# Patient Record
Sex: Female | Born: 1990 | Race: Black or African American | Hispanic: No | Marital: Single | Smoking: Former smoker
Health system: Southern US, Community
[De-identification: ages and names within clinical notes are randomized; demographics above are authoritative.]

## PROBLEM LIST (undated history)

## (undated) DIAGNOSIS — Z789 Other specified health status: Secondary | ICD-10-CM

## (undated) HISTORY — PX: NO PAST SURGERIES: SHX2092

## (undated) HISTORY — DX: Other specified health status: Z78.9

---
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
## 2017-10-26 ENCOUNTER — Ambulatory Visit (INDEPENDENT_AMBULATORY_CARE_PROVIDER_SITE_OTHER): Payer: Managed Care, Other (non HMO) | Admitting: Obstetrics and Gynecology

## 2017-10-26 ENCOUNTER — Encounter: Payer: Self-pay | Admitting: Obstetrics and Gynecology

## 2017-10-26 VITALS — BP 116/75 | HR 90 | Ht 66.5 in | Wt 204.8 lb

## 2017-10-26 DIAGNOSIS — Z113 Encounter for screening for infections with a predominantly sexual mode of transmission: Secondary | ICD-10-CM

## 2017-10-26 DIAGNOSIS — Z01419 Encounter for gynecological examination (general) (routine) without abnormal findings: Secondary | ICD-10-CM

## 2017-10-26 DIAGNOSIS — Z0000 Encounter for general adult medical examination without abnormal findings: Secondary | ICD-10-CM

## 2017-10-26 NOTE — Progress Notes (Signed)
GYNECOLOGY ANNUAL PREVENTATIVE CARE ENCOUNTER NOTE  Subjective:   Ellen Mooney is a 27 y.o. G0P0000 female here for a annual gynecologic exam. Current complaints: spotting a week before her period, has only happened once. Has regular monthly periods, bleeding 4 days, heavy only on second day. Is sexually active, using condoms for birth control, not interested in anything else for contraception currently.  Denies abnormal vaginal bleeding, discharge, pelvic pain, problems with intercourse or other gynecologic concerns.    Gynecologic History Patient's last menstrual period was 10/03/2017 (exact date). Contraception: condoms Last Pap: has had but not sure when. Results were: normal Last mammogram: n/a Gardisil: has received  Obstetric History OB History  Gravida Para Term Preterm AB Living  0 0 0 0 0 0  SAB TAB Ectopic Multiple Live Births  0 0 0 0 0       Past Medical History:  Diagnosis Date  . Known health problems: none    Past Surgical History:  Procedure Laterality Date  . NO PAST SURGERIES      No current outpatient medications on file prior to visit.   No current facility-administered medications on file prior to visit.     Allergies  Allergen Reactions  . Peanut-Containing Drug Products Anaphylaxis    Social History   Socioeconomic History  . Marital status: Single    Spouse name: Not on file  . Number of children: Not on file  . Years of education: Not on file  . Highest education level: Not on file  Social Needs  . Financial resource strain: Not on file  . Food insecurity - worry: Not on file  . Food insecurity - inability: Not on file  . Transportation needs - medical: Not on file  . Transportation needs - non-medical: Not on file  Occupational History  . Not on file  Tobacco Use  . Smoking status: Current Every Day Smoker    Types: E-cigarettes  . Smokeless tobacco: Never Used  . Tobacco comment: Juuel Vape  Substance and Sexual  Activity  . Alcohol use: Yes    Alcohol/week: 4.2 oz    Types: 7 Glasses of wine per week    Frequency: Never  . Drug use: No  . Sexual activity: Yes    Birth control/protection: None  Other Topics Concern  . Not on file  Social History Narrative  . Not on file    Family History  Problem Relation Age of Onset  . Diabetes Paternal Grandfather   . Hypertension Paternal Grandfather   . Stroke Paternal Grandfather   . Heart attack Paternal Grandfather   . Cancer Paternal Grandmother        Breast  . Diabetes Maternal Grandfather   . Hypertension Maternal Grandfather   . Thyroid disease Father   . Hypertension Father   . Spondylolisthesis Mother   . Clotting disorder Mother   . Asthma Brother     Diet: could be better Exercise: walks 4-5 miles at work  The following portions of the patient's history were reviewed and updated as appropriate: allergies, current medications, past family history, past medical history, past social history, past surgical history and problem list.  Review of Systems Pertinent items are noted in HPI.   Objective:  BP 116/75   Pulse 90   Ht 5' 6.5" (1.689 m)   Wt 204 lb 12.8 oz (92.9 kg)   LMP 10/03/2017 (Exact Date)   BMI 32.56 kg/m  CONSTITUTIONAL: Well-developed, well-nourished female in no acute  distress.  HENT:  Normocephalic, atraumatic, External right and left ear normal. Oropharynx is clear and moist EYES: Conjunctivae and EOM are normal. Pupils are equal, round, and reactive to light. No scleral icterus.  NECK: Normal range of motion, supple, no masses.  Normal thyroid.  SKIN: Skin is warm and dry. No rash noted. Not diaphoretic. No erythema. No pallor. NEUROLOGIC: Alert and oriented to person, place, and time. Normal reflexes, muscle tone coordination. No cranial nerve deficit noted. PSYCHIATRIC: Normal mood and affect. Normal behavior. Normal judgment and thought content. CARDIOVASCULAR: Normal heart rate noted, regular  rhythm RESPIRATORY: Clear to auscultation bilaterally. Effort and breath sounds normal, no problems with respiration noted. BREASTS: Symmetric in size. No masses, skin changes, nipple drainage, or lymphadenopathy. Nipple rings present bilaterally ABDOMEN: Soft, normal bowel sounds, no distention noted.  No tenderness, rebound or guarding.  PELVIC: Normal appearing external genitalia; normal appearing vaginal mucosa and cervix.  No abnormal discharge noted.  Pap smear obtained.  Normal uterine size, no other palpable masses, no uterine or adnexal tenderness. MUSCULOSKELETAL: Normal range of motion. No tenderness.  No cyanosis, clubbing, or edema.  2+ distal pulses.   Assessment and Plan:   1. Annual physical exam - Cervicovaginal ancillary only - Cytology - PAP - declines further contraception  2. Routine screening for STI (sexually transmitted infection) - Hepatitis B surface antigen - Hepatitis C antibody - HIV antibody - RPR  Smoking and tobacco cessation was discussed at today's visit for 4 minutes    Will follow up results of pap smear/STI screen and manage accordingly. Encouraged improvement in diet and exercise.    Routine preventative health maintenance measures emphasized. Please refer to After Visit Summary for other counseling recommendations.    Baldemar LenisK. Meryl Davis, M.D. Attending Obstetrician & Gynecologist, [facility], [facility]

## 2017-10-27 LAB — HEPATITIS C ANTIBODY: Hep C Virus Ab: <0.1 s/co ratio (ref 0.0–0.9)

## 2017-10-27 LAB — HEPATITIS B SURFACE ANTIGEN: Hepatitis B Surface Ag: NEGATIVE

## 2017-10-27 LAB — RPR: RPR Ser Ql: NONREACTIVE

## 2017-10-27 LAB — HIV ANTIBODY (ROUTINE TESTING W REFLEX): HIV Screen 4th Generation wRfx: NONREACTIVE

## 2017-10-28 ENCOUNTER — Encounter: Payer: Self-pay | Admitting: Family Medicine

## 2017-10-28 LAB — CYTOLOGY - PAP: Diagnosis: NEGATIVE

## 2017-10-28 LAB — CERVICOVAGINAL ANCILLARY ONLY
Chlamydia: NEGATIVE
Neisseria Gonorrhea: NEGATIVE
Trichomonas: NEGATIVE

## 2017-12-22 NOTE — Progress Notes (Signed)
Subjective:    Patient ID: Ellen Mooney, female    DOB: Aug 04, 1991, 27 y.o.   MRN: [id number]  HPI Chief Complaint  Patient presents with  . Annual Exam    physcial for work, [facility]  went in Feb;    She is new to the practice and here for a complete physical exam. Previous medical care: moved here from [place], [place].  Last CPE: 10/2017 at her OB/GYN   Other providers: Dr. Earlene Plater (OBGYN)  No concerns today.   Social history: Lives alone, works at [employer] as a Chartered certified accountant. Has a plan to go to graduate school. Degrees in psychology and criminal justice.   vapes, drinks alcohol- 2 medium size glasses per night for the past 2 months, denies drug use   States she drinks alcohol nightly due to boredom. Denies self medicating for an underlying issue.   Diet: fairly unhealthy  Excerise: nothing currently   Immunizations: Tdap 10 year ago.   Health maintenance:  Mammogram: never  Colonoscopy: never  Last Gynecological Exam: Feb 18th, 2019 Last Menstrual cycle: 12/19/17 Pregnancies: 0 Uses condoms  Last Dental Exam: 09/2016  Last Eye Exam: 10/2016  Wears seatbelt always, smoke detectors in home and functioning, does not text while driving and feels safe in home environment.   Reviewed allergies, medications, past medical, surgical, family, and social history.   Review of Systems Review of Systems Constitutional: -fever, -chills, -sweats, -unexpected weight change,-fatigue ENT: -runny nose, -ear pain, -sore throat Cardiology:  -chest pain, -palpitations, -edema Respiratory: -cough, -shortness of breath, -wheezing Gastroenterology: -abdominal pain, -nausea, -vomiting, -diarrhea, -constipation  Hematology: -bleeding or bruising problems Musculoskeletal: -arthralgias, -myalgias, -joint swelling, -back pain Ophthalmology: -vision changes Urology: -dysuria, -difficulty urinating, -hematuria, -urinary frequency, -urgency Neurology: -headache,  -weakness, -tingling, -numbness       Objective:   Physical Exam BP 124/80 (BP Location: Right Arm, Patient Position: Sitting, Cuff Size: Normal)   Pulse 70   Temp 98.1 F (36.7 C) (Oral)   Ht 5' 5.5" (1.664 m)   Wt 203 lb 6.4 oz (92.3 kg)   LMP 12/19/2017 (Exact Date)   SpO2 98%   BMI 33.33 kg/m   General Appearance:    Alert, cooperative, no distress, appears stated age  Head:    Normocephalic, without obvious abnormality, atraumatic  Eyes:    PERRL, conjunctiva/corneas clear, EOM's intact, fundi    benign  Ears:    Normal TM's and external ear canals  Nose:   Nares normal, mucosa normal, no drainage or sinus   tenderness  Throat:   Lips, mucosa, and tongue normal; teeth and gums normal  Neck:   Supple, no lymphadenopathy;  thyroid:  no   enlargement/tenderness/nodules; no carotid   bruit or JVD  Back:    Spine nontender, no curvature, ROM normal, no CVA     tenderness  Lungs:     Clear to auscultation bilaterally without wheezes, rales or     ronchi; respirations unlabored  Chest Wall:    No tenderness or deformity   Heart:    Regular rate and rhythm, S1 and S2 normal, no murmur, rub   or gallop  Breast Exam:    Done at OB/GYN  Abdomen:     Soft, non-tender, nondistended, normoactive bowel sounds,    no masses, no hepatosplenomegaly  Genitalia:    Done at OB/GYN  Rectal:    Not performed due to age<40 and no related complaints  Extremities:   No clubbing,  cyanosis or edema  Pulses:   2+ and symmetric all extremities  Skin:   Skin color, texture, turgor normal, no rashes or lesions  Lymph nodes:   Cervical, supraclavicular, and axillary nodes normal  Neurologic:   CNII-XII intact, normal strength, sensation and gait; reflexes 2+ and symmetric throughout          Psych:   Normal mood, affect, hygiene and grooming.     Urinalysis dipstick:       Assessment & Plan:  Routine general medical examination at a health care facility - Plan: CBC with Differential/Platelet,  Comprehensive metabolic panel, POCT Urinalysis DIP (Proadvantage Device), TSH  Obesity (BMI 30-39.9) - Plan: CBC with Differential/Platelet, Comprehensive metabolic panel, TSH  Family history of thyroid disease in father - Plan: TSH  Screening for lipid disorders - Plan: Lipid panel  Need for Tdap vaccination - Plan: Tdap vaccine greater than or equal to 7yo IM  Electronic cigarette use  Vaccine counseling  She appears to be doing well overall. Tdap given and counseling done on all components of vaccine. Advised her to cut back on alcohol use, she is drinking currently half a bottle of wine per day. Discussed potential risks involved with long term alcohol use.  Encouraged her to stop vaping. Counseling done on healthy lifestyle.  She is aware that her BMI places her in the obese category.  Discussed potential health conditions related to obesity. Advised to cut back on carbohydrates and increase her physical activity. Follow-up pending labs.

## 2017-12-23 ENCOUNTER — Encounter: Payer: Self-pay | Admitting: Family Medicine

## 2017-12-23 ENCOUNTER — Ambulatory Visit (INDEPENDENT_AMBULATORY_CARE_PROVIDER_SITE_OTHER): Payer: Managed Care, Other (non HMO) | Admitting: Family Medicine

## 2017-12-23 VITALS — BP 124/80 | HR 70 | Temp 98.1°F | Ht 65.5 in | Wt 203.4 lb

## 2017-12-23 DIAGNOSIS — Z13220 Encounter for screening for lipoid disorders: Secondary | ICD-10-CM | POA: Diagnosis not present

## 2017-12-23 DIAGNOSIS — Z23 Encounter for immunization: Secondary | ICD-10-CM

## 2017-12-23 DIAGNOSIS — Z789 Other specified health status: Secondary | ICD-10-CM

## 2017-12-23 DIAGNOSIS — Z0000 Encounter for general adult medical examination without abnormal findings: Secondary | ICD-10-CM

## 2017-12-23 DIAGNOSIS — Z7185 Encounter for immunization safety counseling: Secondary | ICD-10-CM

## 2017-12-23 DIAGNOSIS — E669 Obesity, unspecified: Secondary | ICD-10-CM | POA: Diagnosis not present

## 2017-12-23 DIAGNOSIS — Z8349 Family history of other endocrine, nutritional and metabolic diseases: Secondary | ICD-10-CM

## 2017-12-23 DIAGNOSIS — Z7189 Other specified counseling: Secondary | ICD-10-CM | POA: Diagnosis not present

## 2017-12-23 NOTE — Patient Instructions (Addendum)
Cut back on alcohol use.  Stop vaping.  Make sure you are getting at least 150 minutes of physical activity outside of your usual daily activity. We will call you with your lab results.  Preventative Care for Adults - Female      MAINTAIN REGULAR HEALTH EXAMS:  A routine yearly physical is a good way to check in with your primary care provider about your health and preventive screening. It is also an opportunity to share updates about your health and any concerns you have, and receive a thorough all-over exam.   Most health insurance companies pay for at least some preventative services.  Check with your health plan for specific coverages.  WHAT PREVENTATIVE SERVICES DO WOMEN NEED?  Adult women should have their weight and blood pressure checked regularly.   Women age 28 and older should have their cholesterol levels checked regularly.  Women should be screened for cervical cancer with a Pap smear and pelvic exam beginning at age 28.  Breast cancer screening generally begins at age 28 with a mammogram and breast exam by your primary care provider.    Beginning at age 28 and continuing to age 28, women should be screened for colorectal cancer.  Certain people may need continued testing until age 28.  Updating vaccinations is part of preventative care.  Vaccinations help protect against diseases such as the flu.  Osteoporosis is a disease in which the bones lose minerals and strength as we age. Women ages 4765 and over should discuss this with their caregivers, as should women after menopause who have other risk factors.  Lab tests are generally done as part of preventative care to screen for anemia and blood disorders, to screen for problems with the kidneys and liver, to screen for bladder problems, to check blood sugar, and to check your cholesterol level.  Preventative services generally include counseling about diet, exercise, avoiding tobacco, drugs, excessive alcohol consumption, and  sexually transmitted infections.    GENERAL RECOMMENDATIONS FOR GOOD HEALTH:  Healthy diet:  Eat a variety of foods, including fruit, vegetables, animal or vegetable protein, such as meat, fish, chicken, and eggs, or beans, lentils, tofu, and grains, such as rice.  Drink plenty of water daily.  Decrease saturated fat in the diet, avoid lots of red meat, processed foods, sweets, fast foods, and fried foods.  Exercise:  Aerobic exercise helps maintain good heart health. At least 30-40 minutes of moderate-intensity exercise is recommended. For example, a brisk walk that increases your heart rate and breathing. This should be done on most days of the week.   Find a type of exercise or a variety of exercises that you enjoy so that it becomes a part of your daily life.  Examples are running, walking, swimming, water aerobics, and biking.  For motivation and support, explore group exercise such as aerobic class, spin class, Zumba, Yoga,or  martial arts, etc.    Set exercise goals for yourself, such as a certain weight goal, walk or run in a race such as a 5k walk/run.  Speak to your primary care provider about exercise goals.  Disease prevention:  If you smoke or chew tobacco, find out from your caregiver how to quit. It can literally save your life, no matter how long you have been a tobacco user. If you do not use tobacco, never begin.   Maintain a healthy diet and normal weight. Increased weight leads to problems with blood pressure and diabetes.   The Body Mass Index  or BMI is a way of measuring how much of your body is fat. Having a BMI above 27 increases the risk of heart disease, diabetes, hypertension, stroke and other problems related to obesity. Your caregiver can help determine your BMI and based on it develop an exercise and dietary program to help you achieve or maintain this important measurement at a healthful level.  High blood pressure causes heart and blood vessel problems.   Persistent high blood pressure should be treated with medicine if weight loss and exercise do not work.   Fat and cholesterol leaves deposits in your arteries that can block them. This causes heart disease and vessel disease elsewhere in your body.  If your cholesterol is found to be high, or if you have heart disease or certain other medical conditions, then you may need to have your cholesterol monitored frequently and be treated with medication.   Ask if you should have a cardiac stress test if your history suggests this. A stress test is a test done on a treadmill that looks for heart disease. This test can find disease prior to there being a problem.  Menopause can be associated with physical symptoms and risks. Hormone replacement therapy is available to decrease these. You should talk to your caregiver about whether starting or continuing to take hormones is right for you.   Osteoporosis is a disease in which the bones lose minerals and strength as we age. This can result in serious bone fractures. Risk of osteoporosis can be identified using a bone density scan. Women ages 94 and over should discuss this with their caregivers, as should women after menopause who have other risk factors. Ask your caregiver whether you should be taking a calcium supplement and Vitamin D, to reduce the rate of osteoporosis.   Avoid drinking alcohol in excess (more than two drinks per day).  Avoid use of street drugs. Do not share needles with anyone. Ask for professional help if you need assistance or instructions on stopping the use of alcohol, cigarettes, and/or drugs.  Brush your teeth twice a day with fluoride toothpaste, and floss once a day. Good oral hygiene prevents tooth decay and gum disease. The problems can be painful, unattractive, and can cause other health problems. Visit your dentist for a routine oral and dental check up and preventive care every 6-12 months.   Look at your skin regularly.  Use a  mirror to look at your back. Notify your caregivers of changes in moles, especially if there are changes in shapes, colors, a size larger than a pencil eraser, an irregular border, or development of new moles.  Safety:  Use seatbelts 100% of the time, whether driving or as a passenger.  Use safety devices such as hearing protection if you work in environments with loud noise or significant background noise.  Use safety glasses when doing any work that could send debris in to the eyes.  Use a helmet if you ride a bike or motorcycle.  Use appropriate safety gear for contact sports.  Talk to your caregiver about gun safety.  Use sunscreen with a SPF (or skin protection factor) of 15 or greater.  Lighter skinned people are at a greater risk of skin cancer. Don't forget to also wear sunglasses in order to protect your eyes from too much damaging sunlight. Damaging sunlight can accelerate cataract formation.   Practice safe sex. Use condoms. Condoms are used for birth control and to help reduce the spread of sexually transmitted  infections (or STIs).  Some of the STIs are gonorrhea (the clap), chlamydia, syphilis, trichomonas, herpes, HPV (human papilloma virus) and HIV (human immunodeficiency virus) which causes AIDS. The herpes, HIV and HPV are viral illnesses that have no cure. These can result in disability, cancer and death.   Keep carbon monoxide and smoke detectors in your home functioning at all times. Change the batteries every 6 months or use a model that plugs into the wall.   Vaccinations:  Stay up to date with your tetanus shots and other required immunizations. You should have a booster for tetanus every 10 years. Be sure to get your flu shot every year, since 5%-20% of the [place] population comes down with the flu. The flu vaccine changes each year, so being vaccinated once is not enough. Get your shot in the fall, before the flu season peaks.   Other vaccines to consider:  Human Papilloma  Virus or HPV causes cancer of the cervix, and other infections that can be transmitted from person to person. There is a vaccine for HPV, and females should get immunized between the ages of 16 and 14. It requires a series of 3 shots.   Pneumococcal vaccine to protect against certain types of pneumonia.  This is normally recommended for adults age 66 or older.  However, adults younger than 28 years old with certain underlying conditions such as diabetes, heart or lung disease should also receive the vaccine.  Shingles vaccine to protect against Varicella Zoster if you are older than age 67, or younger than 28 years old with certain underlying illness.  Hepatitis A vaccine to protect against a form of infection of the liver by a virus acquired from food.  Hepatitis B vaccine to protect against a form of infection of the liver by a virus acquired from blood or body fluids, particularly if you work in health care.  If you plan to travel internationally, check with your local health department for specific vaccination recommendations.  Cancer Screening:  Breast cancer screening is essential to preventive care for women. All women age 83 and older should perform a breast self-exam every month. At age 58 and older, women should have their caregiver complete a breast exam each year. Women at ages 93 and older should have a mammogram (x-ray film) of the breasts. Your caregiver can discuss how often you need mammograms.    Cervical cancer screening includes taking a Pap smear (sample of cells examined under a microscope) from the cervix (end of the uterus). It also includes testing for HPV (Human Papilloma Virus, which can cause cervical cancer). Screening and a pelvic exam should begin at age 84, or 3 years after a woman becomes sexually active. Screening should occur every year, with a Pap smear but no HPV testing, up to age 41. After age 49, you should have a Pap smear every 3 years with HPV testing, if no  HPV was found previously.   Most routine colon cancer screening begins at the age of 50. On a yearly basis, doctors may provide special easy to use take-home tests to check for hidden blood in the stool. Sigmoidoscopy or colonoscopy can detect the earliest forms of colon cancer and is life saving. These tests use a small camera at the end of a tube to directly examine the colon. Speak to your caregiver about this at age 73, when routine screening begins (and is repeated every 5 years unless early forms of pre-cancerous polyps or small growths are found).

## 2017-12-24 LAB — COMPREHENSIVE METABOLIC PANEL
ALT: 11 IU/L (ref 0–32)
AST: 14 IU/L (ref 0–40)
Albumin/Globulin Ratio: 2.2 (ref 1.2–2.2)
Albumin: 4.6 g/dL (ref 3.5–5.5)
Alkaline Phosphatase: 47 IU/L (ref 39–117)
BUN/Creatinine Ratio: 10 (ref 9–23)
BUN: 8 mg/dL (ref 6–20)
Bilirubin Total: 0.3 mg/dL (ref 0.0–1.2)
CO2: 23 mmol/L (ref 20–29)
Calcium: 9.1 mg/dL (ref 8.7–10.2)
Chloride: 104 mmol/L (ref 96–106)
Creatinine, Ser: 0.77 mg/dL (ref 0.57–1.00)
GFR calc Af Amer: 122 mL/min/{1.73_m2} (ref >59)
GFR calc non Af Amer: 106 mL/min/{1.73_m2} (ref >59)
Globulin, Total: 2.1 g/dL (ref 1.5–4.5)
Glucose: 77 mg/dL (ref 65–99)
Potassium: 4 mmol/L (ref 3.5–5.2)
Sodium: 140 mmol/L (ref 134–144)
Total Protein: 6.7 g/dL (ref 6.0–8.5)

## 2017-12-24 LAB — LIPID PANEL
Chol/HDL Ratio: 2.2 ratio (ref 0.0–4.4)
Cholesterol, Total: 138 mg/dL (ref 100–199)
HDL: 64 mg/dL
LDL Calculated: 62 mg/dL (ref 0–99)
Triglycerides: 60 mg/dL (ref 0–149)
VLDL Cholesterol Cal: 12 mg/dL (ref 5–40)

## 2017-12-24 LAB — CBC WITH DIFFERENTIAL/PLATELET
Basophils Absolute: 0 10*3/uL (ref 0.0–0.2)
Basos: 0 %
EOS (ABSOLUTE): 0.6 10*3/uL — ABNORMAL HIGH (ref 0.0–0.4)
Eos: 6 %
Hematocrit: 37.1 % (ref 34.0–46.6)
Hemoglobin: 12.5 g/dL (ref 11.1–15.9)
Immature Grans (Abs): 0 10*3/uL (ref 0.0–0.1)
Immature Granulocytes: 0 %
Lymphocytes Absolute: 2.4 10*3/uL (ref 0.7–3.1)
Lymphs: 23 %
MCH: 28.6 pg (ref 26.6–33.0)
MCHC: 33.7 g/dL (ref 31.5–35.7)
MCV: 85 fL (ref 79–97)
Monocytes Absolute: 0.8 10*3/uL (ref 0.1–0.9)
Monocytes: 8 %
Neutrophils Absolute: 6.3 10*3/uL (ref 1.4–7.0)
Neutrophils: 63 %
Platelets: 262 10*3/uL (ref 150–379)
RBC: 4.37 x10E6/uL (ref 3.77–5.28)
RDW: 13.4 % (ref 12.3–15.4)
WBC: 10.1 10*3/uL (ref 3.4–10.8)

## 2017-12-24 LAB — TSH: TSH: 1.26 u[IU]/mL (ref 0.450–4.500)

## 2018-03-17 ENCOUNTER — Encounter: Payer: Self-pay | Admitting: Family Medicine

## 2018-03-17 ENCOUNTER — Ambulatory Visit (INDEPENDENT_AMBULATORY_CARE_PROVIDER_SITE_OTHER): Payer: Managed Care, Other (non HMO) | Admitting: Family Medicine

## 2018-03-17 VITALS — BP 128/80 | HR 72 | Temp 98.8°F | Resp 16 | Ht 66.5 in | Wt 208.6 lb

## 2018-03-17 DIAGNOSIS — R197 Diarrhea, unspecified: Secondary | ICD-10-CM | POA: Diagnosis not present

## 2018-03-17 DIAGNOSIS — R110 Nausea: Secondary | ICD-10-CM | POA: Diagnosis not present

## 2018-03-17 DIAGNOSIS — Z8249 Family history of ischemic heart disease and other diseases of the circulatory system: Secondary | ICD-10-CM | POA: Diagnosis not present

## 2018-03-17 DIAGNOSIS — Z3201 Encounter for pregnancy test, result positive: Secondary | ICD-10-CM

## 2018-03-17 DIAGNOSIS — Z9889 Other specified postprocedural states: Secondary | ICD-10-CM | POA: Diagnosis not present

## 2018-03-17 DIAGNOSIS — Z309 Encounter for contraceptive management, unspecified: Secondary | ICD-10-CM | POA: Diagnosis not present

## 2018-03-17 LAB — POCT URINE PREGNANCY: Preg Test, Ur: POSITIVE — AB

## 2018-03-17 NOTE — Progress Notes (Signed)
   Subjective:    Patient ID: Ellen Mooney, female    DOB: Oct 23, 1990, 27 y.o.   MRN: [id number]  HPI Chief Complaint  Patient presents with  . birth control    start on another birth control.    She is here requesting to switch birth control pills.  [facility] [place] States she had a D & C on June 28th 2019. States she was [redacted] weeks pregnant. No children.   States she started on Sprintec 2 weeks ago and stopped it 2 days ago due to side effects. Complains of severe nausea and diarrhea that seems to be improving since being off OCP. States her face also broke out with pimples. Diarrhea is improving. No vomiting or abdominal pain.  Abdominal cramping 3 days ago that has since resolved.   States in the past she has been on Depo Provera injections. Is not interested in this method.   No fever, chills, headache, dizziness, chest pain, palpitations, shortness of breath, back pain, abdominal pain, urinary symptoms, vaginal discharge, leg or calf pain.   States she is doing fine emotionally.   Reports her mother had a DVT and PEs thought to be related to birth control.   Last CPE with me 12/23/2017 and with her OB/GYN Dr. Earlene Plateravis on 10/26/2017. Normal pap smear and negative STI panel.   LMP: May 2019  Reviewed allergies, medications, past medical, surgical, family, and social history.    Review of Systems Pertinent positives and negatives in the history of present illness.     Objective:   Physical Exam BP 128/80   Pulse 72   Temp 98.8 F (37.1 C) (Oral)   Resp 16   Ht 5' 6.5" (1.689 m)   Wt 208 lb 9.6 oz (94.6 kg)   LMP 01/14/2018   SpO2 98%   BMI 33.16 kg/m  Alert and in no distress. Pharyngeal area is normal. Neck is supple without adenopathy or thyromegaly. Cardiac exam shows a regular sinus rhythm without murmurs or gallops. Lungs are clear to auscultation. Abdomen is soft, non distended, nontender, normal BS, no palpable masses. No  CVAT. Extremities without edema, normal pulses. Skin is warm and dry, no pallor.   UPT +    Assessment & Plan:  Encounter for contraceptive management, unspecified type - Plan: POCT urine pregnancy  Nausea - Plan: CBC with Differential/Platelet, Comprehensive metabolic panel  Diarrhea, unspecified type - Plan: CBC with Differential/Platelet, Comprehensive metabolic panel  Family history of DVT  History of elective abortion - Plan: Beta hCG quant (ref lab)  Positive urine pregnancy test - Plan: Beta hCG quant (ref lab)  D & C on 03/05/2018. UPT positive today. Will check labs including HCG quant.  She is well appearing and no sign of infectious process. No sign of physical or emotional distress.  I am referring her back to Dr. Earlene Plateravis, her OB/GYN, for further counseling on contraception due to recent side effects with estrogen and mother with DVT, PE history related to birth control. She is considering an alternative method to OCPs.  Follow up pending labs or as needed.

## 2018-03-17 NOTE — Patient Instructions (Signed)
Call and schedule with your OB/GYN for further evaluation and counseling regarding the best method of birth control for you based on risk factors.

## 2018-03-18 LAB — CBC WITH DIFFERENTIAL/PLATELET
Basophils Absolute: 0.1 10*3/uL (ref 0.0–0.2)
Basos: 1 %
EOS (ABSOLUTE): 0.5 10*3/uL — ABNORMAL HIGH (ref 0.0–0.4)
Eos: 5 %
Hematocrit: 35.7 % (ref 34.0–46.6)
Hemoglobin: 11.6 g/dL (ref 11.1–15.9)
Immature Grans (Abs): 0 10*3/uL (ref 0.0–0.1)
Immature Granulocytes: 0 %
Lymphocytes Absolute: 2.4 10*3/uL (ref 0.7–3.1)
Lymphs: 25 %
MCH: 28.5 pg (ref 26.6–33.0)
MCHC: 32.5 g/dL (ref 31.5–35.7)
MCV: 88 fL (ref 79–97)
Monocytes Absolute: 0.8 10*3/uL (ref 0.1–0.9)
Monocytes: 8 %
Neutrophils Absolute: 5.8 10*3/uL (ref 1.4–7.0)
Neutrophils: 61 %
Platelets: 243 10*3/uL (ref 150–450)
RBC: 4.07 x10E6/uL (ref 3.77–5.28)
RDW: 13.5 % (ref 12.3–15.4)
WBC: 9.5 10*3/uL (ref 3.4–10.8)

## 2018-03-18 LAB — COMPREHENSIVE METABOLIC PANEL
ALT: 10 IU/L (ref 0–32)
AST: 12 IU/L (ref 0–40)
Albumin/Globulin Ratio: 2.1 (ref 1.2–2.2)
Albumin: 4.5 g/dL (ref 3.5–5.5)
Alkaline Phosphatase: 40 IU/L (ref 39–117)
BUN/Creatinine Ratio: 9 (ref 9–23)
BUN: 8 mg/dL (ref 6–20)
Bilirubin Total: 0.3 mg/dL (ref 0.0–1.2)
CO2: 19 mmol/L — ABNORMAL LOW (ref 20–29)
Calcium: 9.1 mg/dL (ref 8.7–10.2)
Chloride: 105 mmol/L (ref 96–106)
Creatinine, Ser: 0.88 mg/dL (ref 0.57–1.00)
GFR calc Af Amer: 104 mL/min/{1.73_m2} (ref >59)
GFR calc non Af Amer: 90 mL/min/{1.73_m2} (ref >59)
Globulin, Total: 2.1 g/dL (ref 1.5–4.5)
Glucose: 84 mg/dL (ref 65–99)
Potassium: 4.2 mmol/L (ref 3.5–5.2)
Sodium: 143 mmol/L (ref 134–144)
Total Protein: 6.6 g/dL (ref 6.0–8.5)

## 2018-03-18 LAB — BETA HCG QUANT (REF LAB): hCG Quant: 241 m[IU]/mL

## 2018-03-22 ENCOUNTER — Ambulatory Visit (INDEPENDENT_AMBULATORY_CARE_PROVIDER_SITE_OTHER): Payer: Managed Care, Other (non HMO) | Admitting: Nurse Practitioner

## 2018-03-22 ENCOUNTER — Encounter: Payer: Self-pay | Admitting: Nurse Practitioner

## 2018-03-22 VITALS — BP 124/85 | HR 79 | Wt 207.5 lb

## 2018-03-22 DIAGNOSIS — Z3202 Encounter for pregnancy test, result negative: Secondary | ICD-10-CM | POA: Diagnosis not present

## 2018-03-22 DIAGNOSIS — Z30013 Encounter for initial prescription of injectable contraceptive: Secondary | ICD-10-CM | POA: Insufficient documentation

## 2018-03-22 DIAGNOSIS — Z3009 Encounter for other general counseling and advice on contraception: Secondary | ICD-10-CM | POA: Diagnosis not present

## 2018-03-22 LAB — POCT URINE PREGNANCY: Preg Test, Ur: NEGATIVE

## 2018-03-22 MED ORDER — MEDROXYPROGESTERONE ACETATE 150 MG/ML IM SUSP: 150.0000 mg | INTRAMUSCULAR | 3 refills | Status: DC

## 2018-03-22 NOTE — Patient Instructions (Signed)
[email or web address] for contraceptive info

## 2018-03-22 NOTE — Progress Notes (Signed)
Pt requests to discuss birth control options. Pt reports mother had a DVT and PE related to [facility]. Recent TAB 03/05/18.

## 2018-03-22 NOTE — Progress Notes (Signed)
   GYNECOLOGY OFFICE VISIT NOTE   History:  27 y.o. G1P0010 here today for birth control consultation.  She denies any abnormal vaginal discharge, bleeding, pelvic pain or other concerns.   She had TAB on 03-05-18 and had a positive pregnancy test 5 days ago.  Used birth control pills for a few days but had nausea, diarrhea, and facial acne so she stopped the pills.  She relayed that her mother had a DVT and PE on "birth control" (unsure of the type).  Discussed non Estrogen methods with her based on this first degree family history.  No history of migraines.  She has used Depo in the past and like it except for the weight gain.  Discussed Nexplanon and stated there still may be some increased weight gain but likely less than Depo.  Discussed more unexpected vaginal bleeding with nexplanon.  Advised there is usually less cramping with menses with Mirena IUD and may have lighter or no menses.  Past Medical History:  Diagnosis Date  . Known health problems: none     Past Surgical History:  Procedure Laterality Date  . NO PAST SURGERIES      The following portions of the patient's history were reviewed and updated as appropriate: allergies, current medications, past family history, past medical history, past social history, past surgical history and problem list.   Health Maintenance:  Normal pap on 10-26-17  Review of Systems:  Pertinent items noted in HPI and remainder of comprehensive ROS otherwise negative.  Objective:  Physical Exam BP 124/85   Pulse 79   Wt 207 lb 8 oz (94.1 kg)   LMP 01/14/2018   BMI 32.99 kg/m  CONSTITUTIONAL: Well-developed, well-nourished female in no acute distress.  HENT:  Normocephalic, atraumatic. External right and left ear normal.  EYES: Conjunctivae and EOM are normal. No scleral icterus.  NECK: Normal range of motion, supple, no masses SKIN: Skin is warm and dry. No rash noted. Not diaphoretic. No erythema. No pallor. NEUROLOGIC: Alert and  oriented to person, place, and time. Normal reflexes, muscle tone coordination. No cranial nerve deficit noted. PSYCHIATRIC: Normal mood and affect. Normal behavior. Normal judgment and thought content. MUSCULOSKELETAL: Normal range of motion. No edema noted.  Labs and Imaging Urine Pregnancy test is negative  Assessment & Plan:  1. Family planning counseling Initiation of Depo - Depo prescribed to her pharmacy and to return later today for injection. Reproductive life plan discussed and she does not want to be pregnant.  Reviewed LARCs and effectiveness of several methods. Will give Depo today - written info given on Mirena and Nexplanon - to come at 8 weeks on Depo for one if these methods if desired.  Advised to ask questions through MyChart if desired.  - POCT urine pregnancy  Please refer to After Visit Summary for other counseling recommendations.   Return today (on 03/22/2018) for nurse visit for Depo.   Total face-to-face time with patient: 15 minutes.  Over 50% of encounter was spent on counseling and coordination of care.  Nolene BernheimERRI Dann Galicia, RN, MSN, NP-BC Nurse Practitioner, [facility], [facility] 03/22/2018 10:17 AM

## 2018-03-23 ENCOUNTER — Ambulatory Visit (INDEPENDENT_AMBULATORY_CARE_PROVIDER_SITE_OTHER): Payer: Managed Care, Other (non HMO)

## 2018-03-23 VITALS — BP 113/73 | HR 72 | Wt 207.1 lb

## 2018-03-23 DIAGNOSIS — Z3042 Encounter for surveillance of injectable contraceptive: Secondary | ICD-10-CM | POA: Diagnosis not present

## 2018-03-23 DIAGNOSIS — IMO0001 Reserved for inherently not codable concepts without codable children: Secondary | ICD-10-CM

## 2018-03-23 MED ORDER — MEDROXYPROGESTERONE ACETATE 150 MG/ML IM SUSP
150.0000 mg | Freq: Once | INTRAMUSCULAR | Status: AC
  Administered 2018-03-23: 150 mg via INTRAMUSCULAR

## 2018-03-23 NOTE — Progress Notes (Signed)
Presents for DEPO, given in Left deltoid, tolerated well.  Next DEPO 10/1-15/2019  Administrations This Visit    medroxyPROGESTERone (DEPO-PROVERA) injection 150 mg    Admin Date 03/23/2018 Action Given Dose 150 mg Route Intramuscular Administered By Maretta BeesMcGlashan, Vamsi Apfel J, RMA

## 2018-06-14 ENCOUNTER — Ambulatory Visit (INDEPENDENT_AMBULATORY_CARE_PROVIDER_SITE_OTHER): Payer: Managed Care, Other (non HMO)

## 2018-06-14 VITALS — BP 125/81 | HR 75 | Wt 216.8 lb

## 2018-06-14 DIAGNOSIS — Z3042 Encounter for surveillance of injectable contraceptive: Secondary | ICD-10-CM | POA: Diagnosis not present

## 2018-06-14 MED ORDER — MEDROXYPROGESTERONE ACETATE 150 MG/ML IM SUSP
150.0000 mg | Freq: Once | INTRAMUSCULAR | Status: AC
  Administered 2018-06-14: 150 mg via INTRAMUSCULAR

## 2018-06-14 NOTE — Progress Notes (Signed)
Presents for DEPO injection, given in LD, tolerated well.  Next DEPO 12/23-01/02/2019  Administrations This Visit    medroxyPROGESTERone (DEPO-PROVERA) injection 150 mg    Admin Date 06/14/2018 Action Given Dose 150 mg Route Intramuscular Administered By Maretta Bees, RMA

## 2018-08-09 ENCOUNTER — Ambulatory Visit (INDEPENDENT_AMBULATORY_CARE_PROVIDER_SITE_OTHER): Payer: Managed Care, Other (non HMO) | Admitting: Family Medicine

## 2018-08-09 ENCOUNTER — Encounter: Payer: Self-pay | Admitting: Family Medicine

## 2018-08-09 VITALS — BP 110/80 | HR 77 | Wt 213.6 lb

## 2018-08-09 DIAGNOSIS — F329 Major depressive disorder, single episode, unspecified: Secondary | ICD-10-CM

## 2018-08-09 DIAGNOSIS — G4700 Insomnia, unspecified: Secondary | ICD-10-CM | POA: Insufficient documentation

## 2018-08-09 DIAGNOSIS — F32A Depression, unspecified: Secondary | ICD-10-CM

## 2018-08-09 NOTE — Progress Notes (Signed)
   Subjective:    Patient ID: Ellen Mooney, female    DOB: 03-26-1991, 27 y.o.   MRN: [id number]  HPI Chief Complaint  Patient presents with  . depression,    depression- feeling down, 3 weeks to a month   She is here with complaints of a 3-4 week history of feeling sad and unmotivated. Also has difficulty focusing at work. Denies history of depression or anxiety.  She had a miscarriage in July. States she has not really dealt with this. She has friends who are currently pregnant and makes her think of her miscarriage.  States she is not sleeping well. Has issues falling asleep. Taking Z-quil and has tried melatonin in the past. This is not new. Has had sleep issues for years.  Works 3 rd shift.   States her mother noticed she was quiet and did not seem to be herself last week.  Denies SI or HI.   Denies drug or alcohol   Eating fairly healthy and exercising.   She is in a relationship. States "it's complicated".   Reports feeling in her usual state of health physically.  Denies fever, chills, dizziness, chest pain, palpitations, shortness of breath, abdominal pain, N/V/D, urinary symptoms, LE edema.    Depression screen [facility] 08/09/2018 12/23/2017  Decreased Interest 1 0  Down, Depressed, Hopeless 1 0  PHQ - 2 Score 2 0  Altered sleeping 3 -  Tired, decreased energy 1 -  Change in appetite 1 -  Feeling bad or failure about yourself  0 -  Trouble concentrating 3 -  Moving slowly or fidgety/restless 3 -  Suicidal thoughts 0 -  PHQ-9 Score 13 -  Difficult doing work/chores Somewhat difficult -    Reviewed allergies, medications, past medical, surgical, family, and social history.    Review of Systems Pertinent positives and negatives in the history of present illness.     Objective:   Physical Exam BP 110/80   Pulse 77   Wt 213 lb 9.6 oz (96.9 kg)   BMI 33.96 kg/m  Alert and oriented and in no acute distress. Not otherwise examined.      Assessment &  Plan:  Depression, unspecified depression type  Insomnia, unspecified type  Discussed that she appears to be going through a rough patch. She admits that she has not dealt with her miscarriage earlier this year. No thoughts of self harm.  Recommend counseling and she is in agreement.  Discussed that sleep issues may or may not be related with how she deals with stress. Good sleep hygiene counseling done. Recommend that she start journaling.  A list of counselors was provided. She will call and schedule.  Follow up with me in 4 weeks or sooner if needed.

## 2018-08-09 NOTE — Patient Instructions (Signed)
You can call to schedule your appointment with the counselor. A few offices are listed below for you to     [facility] [address] [place], [place] [address] [phone]   [facility]  [address], [place], [place] [address]  Phone: [phone]   [facility] [address] [place], [place] [address]  Phone: [phone]   Darryl A. Hyers, RN, PhD, [facility]  [address], [place], [place] [address] Phone: [phone]  [facility]  [address], [place], [place] [address] Phone: [phone]

## 2018-08-11 ENCOUNTER — Ambulatory Visit: Payer: Managed Care, Other (non HMO) | Admitting: Family Medicine

## 2018-08-30 ENCOUNTER — Ambulatory Visit (INDEPENDENT_AMBULATORY_CARE_PROVIDER_SITE_OTHER): Payer: Managed Care, Other (non HMO)

## 2018-08-30 VITALS — BP 121/74 | HR 72 | Wt 216.0 lb

## 2018-08-30 DIAGNOSIS — Z3042 Encounter for surveillance of injectable contraceptive: Secondary | ICD-10-CM

## 2018-08-30 MED ORDER — MEDROXYPROGESTERONE ACETATE 150 MG/ML IM SUSP
150.0000 mg | Freq: Once | INTRAMUSCULAR | Status: AC
  Administered 2018-08-30: 150 mg via INTRAMUSCULAR

## 2018-08-30 NOTE — Progress Notes (Signed)
Presents for DEPO, injection given in LD, tolerated well.  Next DEPO 03/10-24/2020    . Administrations This Visit    medroxyPROGESTERone (DEPO-PROVERA) injection 150 mg    Admin Date 08/30/2018 Action Given Dose 150 mg Route Intramuscular Administered By Maretta BeesMcGlashan, Darleth Eustache J, RMA

## 2018-09-09 ENCOUNTER — Ambulatory Visit: Payer: Managed Care, Other (non HMO) | Admitting: Family Medicine

## 2018-11-04 ENCOUNTER — Encounter: Payer: Self-pay | Admitting: Obstetrics and Gynecology

## 2018-11-04 ENCOUNTER — Ambulatory Visit (INDEPENDENT_AMBULATORY_CARE_PROVIDER_SITE_OTHER): Payer: 59 | Admitting: Obstetrics and Gynecology

## 2018-11-04 VITALS — BP 119/73 | HR 78 | Ht 66.5 in | Wt 215.0 lb

## 2018-11-04 DIAGNOSIS — Z124 Encounter for screening for malignant neoplasm of cervix: Secondary | ICD-10-CM | POA: Diagnosis not present

## 2018-11-04 DIAGNOSIS — Z113 Encounter for screening for infections with a predominantly sexual mode of transmission: Secondary | ICD-10-CM | POA: Diagnosis not present

## 2018-11-04 DIAGNOSIS — E669 Obesity, unspecified: Secondary | ICD-10-CM

## 2018-11-04 DIAGNOSIS — Z01419 Encounter for gynecological examination (general) (routine) without abnormal findings: Secondary | ICD-10-CM | POA: Diagnosis not present

## 2018-11-04 DIAGNOSIS — Z3009 Encounter for other general counseling and advice on contraception: Secondary | ICD-10-CM

## 2018-11-04 NOTE — Patient Instructions (Addendum)
Etonogestrel implant What is this medicine? ETONOGESTREL (et oh noe JES trel) is a contraceptive (birth control) device. It is used to prevent pregnancy. It can be used for up to 3 years. This medicine may be used for other purposes; ask your health care provider or pharmacist if you have questions. COMMON BRAND NAME(S): Implanon, Nexplanon What should I tell my health care provider before I take this medicine? They need to know if you have any of these conditions: -abnormal vaginal bleeding -blood vessel disease or blood clots -breast, cervical, endometrial, ovarian, liver, or uterine cancer -diabetes -gallbladder disease -heart disease or recent heart attack -high blood pressure -high cholesterol or triglycerides -kidney disease -liver disease -migraine headaches -seizures -stroke -tobacco smoker -an unusual or allergic reaction to etonogestrel, anesthetics or antiseptics, other medicines, foods, dyes, or preservatives -pregnant or trying to get pregnant -breast-feeding How should I use this medicine? This device is inserted just under the skin on the inner side of your upper arm by a health care professional. Talk to your pediatrician regarding the use of this medicine in children. Special care may be needed. Overdosage: If you think you have taken too much of this medicine contact a poison control center or emergency room at once. NOTE: This medicine is only for you. Do not share this medicine with others. What if I miss a dose? This does not apply. What may interact with this medicine? Do not take this medicine with any of the following medications: -amprenavir -fosamprenavir This medicine may also interact with the following medications: -acitretin -aprepitant -armodafinil -bexarotene -bosentan -carbamazepine -certain medicines for fungal infections like fluconazole, ketoconazole, itraconazole and voriconazole -certain medicines to treat hepatitis, HIV or  AIDS -cyclosporine -felbamate -griseofulvin -lamotrigine -modafinil -oxcarbazepine -phenobarbital -phenytoin -primidone -rifabutin -rifampin -rifapentine -St. John's wort -topiramate This list may not describe all possible interactions. Give your health care provider a list of all the medicines, herbs, non-prescription drugs, or dietary supplements you use. Also tell them if you smoke, drink alcohol, or use illegal drugs. Some items may interact with your medicine. What should I watch for while using this medicine? This product does not protect you against HIV infection (AIDS) or other sexually transmitted diseases. You should be able to feel the implant by pressing your fingertips over the skin where it was inserted. Contact your doctor if you cannot feel the implant, and use a non-hormonal birth control method (such as condoms) until your doctor confirms that the implant is in place. Contact your doctor if you think that the implant may have broken or become bent while in your arm. You will receive a user card from your health care provider after the implant is inserted. The card is a record of the location of the implant in your upper arm and when it should be removed. Keep this card with your health records. What side effects may I notice from receiving this medicine? Side effects that you should report to your doctor or health care professional as soon as possible: -allergic reactions like skin rash, itching or hives, swelling of the face, lips, or tongue -breast lumps, breast tissue changes, or discharge -breathing problems -changes in emotions or moods -if you feel that the implant may have broken or bent while in your arm -high blood pressure -pain, irritation, swelling, or bruising at the insertion site -scar at site of insertion -signs of infection at the insertion site such as fever, and skin redness, pain or discharge -signs and symptoms of a blood clot such   as breathing  problems; changes in vision; chest pain; severe, sudden headache; pain, swelling, warmth in the leg; trouble speaking; sudden numbness or weakness of the face, arm or leg -signs and symptoms of liver injury like dark yellow or brown urine; general ill feeling or flu-like symptoms; light-colored stools; loss of appetite; nausea; right upper belly pain; unusually weak or tired; yellowing of the eyes or skin -unusual vaginal bleeding, discharge Side effects that usually do not require medical attention (report to your doctor or health care professional if they continue or are bothersome): -acne -breast pain or tenderness -headache -irregular menstrual bleeding -nausea This list may not describe all possible side effects. Call your doctor for medical advice about side effects. You may report side effects to FDA at 1-800-FDA-1088. Where should I keep my medicine? This drug is given in a hospital or clinic and will not be stored at home. NOTE: This sheet is a summary. It may not cover all possible information. If you have questions about this medicine, talk to your doctor, pharmacist, or health care provider.  2019 Elsevier/Gold Standard (2017-07-14 14:11:42) Intrauterine Device Information An intrauterine device (IUD) is a medical device that is inserted in the uterus to prevent pregnancy. It is a small, T-shaped device that has one or two nylon strings hanging down from it. The strings hang out of the lower part of the uterus (cervix) to allow for future IUD removal. There are two types of IUDs available:  Hormone IUD. This type of IUD is made of plastic and contains the hormone progestin (synthetic progesterone). A hormone IUD may last 3-5 years.  Copper IUD. This type of IUD has copper wire wrapped around it. A copper IUD may last up to 10 years. How is an IUD inserted? An IUD is inserted through the vagina and placed into the uterus with a minor medical procedure. The exact procedure for IUD  insertion may vary among health care providers and hospitals. How does an IUD work? Synthetic progesterone in a hormonal IUD prevents pregnancy by:  Thickening cervical mucus to prevent sperm from entering the uterus.  Thinning the uterine lining to prevent a fertilized egg from being implanted there. Copper in a copper IUD prevents pregnancy by making the uterus and fallopian tubes produce a fluid that kills sperm. What are the advantages of an IUD? Advantages of either type of IUD  It is highly effective in preventing pregnancy.  It is reversible. You can become pregnant shortly after the IUD is removed.  It is low-maintenance and can stay in place for a long time.  There are no estrogen-related side effects.  It can be used when breastfeeding.  It is not associated with weight gain.  It can be inserted right after childbirth, an abortion, or a miscarriage. Advantages of a hormone IUD  If it is inserted within 7 days of your period starting, it works right after it is inserted. If the hormone IUD is inserted at any other time in your cycle, you will need to use a backup method of birth control for 7 days after insertion.  It can make menstrual periods lighter.  It can reduce menstrual cramping.  It can be used for 3-5 years. Advantages of a copper IUD  It works right after it is inserted.  It can be used as a form of emergency birth control if it is inserted within 5 days after having unprotected sex.  It does not interfere with your body's natural hormones.  It can  be used for 10 years. What are the disadvantages of an IUD?  An IUD may cause irregular menstrual bleeding for a period of time after insertion.  You may have pain during insertion and have cramping and vaginal bleeding after insertion.  An IUD may cut the uterus (uterine perforation) when it is inserted. This is rare.  An IUD may cause pelvic inflammatory disease (PID), which is an infection in the  uterus and fallopian tubes. This is rare, and it usually happens during the first 20 days after the IUD is inserted.  A copper IUD can make your menstrual flow heavier and more painful. How is an IUD removed?  You will lie on your back with your knees bent and your feet in footrests (stirrups).  A device will be inserted into your vagina to spread apart the vaginal walls (speculum). This will allow your health care provider to see the strings attached to the IUD.  Your health care provider will use a small instrument (forceps) to grasp the IUD strings and pull firmly until the IUD is removed. You may have some discomfort when the IUD is removed. Your health care provider may recommend taking over-the-counter pain relievers, such as ibuprofen, before the procedure. You may also have minor spotting for a few days after the procedure. The exact procedure for IUD removal may vary among health care providers and hospitals. Is the IUD right for me? Your health care provider will make sure you are a good candidate for an IUD and will discuss the advantages, disadvantages, and possible side effects with you. Summary  An intrauterine device (IUD) is a medical device that is inserted in the uterus to prevent pregnancy. It is a small, T-shaped device that has one or two nylon strings hanging down from it.  A hormone IUD contains the hormone progestin (synthetic progesterone). A copper IUD has copper wire wrapped around it.  Synthetic progesterone in a hormone IUD prevents pregnancy by thickening cervical mucus and thinning the walls of the uterus. Copper in a copper IUD prevents pregnancy by making the uterus and fallopian tubes produce a fluid that kills sperm.  A hormone IUD can be left in place for 3-5 years. A copper IUD can be left in place for up to 10 years.  An IUD is inserted and removed by a health care provider. You may feel some pain during insertion and removal. Your health care provider  may recommend taking over-the-counter pain medicine, such as ibuprofen, before an IUD procedure. This information is not intended to replace advice given to you by your health care provider. Make sure you discuss any questions you have with your health care provider. Document Released: 07/29/2004 Document Revised: 09/23/2016 Document Reviewed: 09/23/2016 Elsevier Interactive Patient Education  2019 [employer].   Use the following websites (and others) to help learn more about your contraception options and find the method that is right for you!  - The Centers for Disease Control [employer]) website: TransferLive.se  - [facility] website: https://www.plannedparenthood.org/learn/birth-control  - Bedsider.org: https://www.bedsider.org/methods

## 2018-11-04 NOTE — Progress Notes (Signed)
GYNECOLOGY ANNUAL PREVENTATIVE CARE ENCOUNTER NOTE  Subjective:   Ellen Mooney is a 28 y.o. G12P0010 female here for a annual gynecologic exam. Current complaints: doesn't like depo. States she has spotting before she gets shot and has spotting for a week after. Denies discharge, pelvic pain, problems with intercourse or other gynecologic concerns.  She desires STI screen. Reviewed normal pap 1 year ago, that guidelines indicate she does not need repeat at this time, however patient strongly desires. She is also interested in an alternate form of contraception as she is having a hard time losing weight.   Gynecologic History No LMP recorded. Patient has had an injection. Contraception: Depo-Provera injections Last Pap: 10/2017. Results were: negative Last mammogram: n/a  Obstetric History OB History  Gravida Para Term Preterm AB Living  1       1    SAB TAB Ectopic Multiple Live Births    1          # Outcome Date GA Lbr Len/2nd Weight Sex Delivery Anes PTL Lv  1 TAB 03/05/18     TAB      Past Medical History:  Diagnosis Date  . Known health problems: none    Past Surgical History:  Procedure Laterality Date  . NO PAST SURGERIES     Current Outpatient Medications on File Prior to Visit  Medication Sig Dispense Refill  . fexofenadine (ALLEGRA) 60 MG tablet Take 60 mg by mouth 2 (two) times daily.    . medroxyPROGESTERone (DEPO-PROVERA) 150 MG/ML injection Inject 1 mL (150 mg total) into the muscle every 3 (three) months. 1 mL 3  . Multiple Vitamin (MULTI-VITAMIN DAILY PO) Take by mouth.     No current facility-administered medications on file prior to visit.     Allergies  Allergen Reactions  . Peanut-Containing Drug Products Anaphylaxis   Social History   Socioeconomic History  . Marital status: Single    Spouse name: Not on file  . Number of children: Not on file  . Years of education: Not on file  . Highest education level: Not on file  Occupational  History  . Not on file  Social Needs  . Financial resource strain: Not on file  . Food insecurity:    Worry: Not on file    Inability: Not on file  . Transportation needs:    Medical: Not on file    Non-medical: Not on file  Tobacco Use  . Smoking status: Former Smoker    Types: E-cigarettes  . Smokeless tobacco: Never Used  . Tobacco comment: Juuel Vape  Substance and Sexual Activity  . Alcohol use: Yes    Alcohol/week: 7.0 standard drinks    Types: 7 Glasses of wine per week    Frequency: Never    Comment: half of bottle of wine per night for the past 2 months   . Drug use: No  . Sexual activity: Yes    Birth control/protection: None, Injection  Lifestyle  . Physical activity:    Days per week: Not on file    Minutes per session: Not on file  . Stress: Not on file  Relationships  . Social connections:    Talks on phone: Not on file    Gets together: Not on file    Attends religious service: Not on file    Active member of club or organization: Not on file    Attends meetings of clubs or organizations: Not on file  Relationship status: Not on file  . Intimate partner violence:    Fear of current or ex partner: Not on file    Emotionally abused: Not on file    Physically abused: Not on file    Forced sexual activity: Not on file  Other Topics Concern  . Not on file  Social History Narrative  . Not on file    Family History  Problem Relation Age of Onset  . Diabetes Paternal Grandfather   . Hypertension Paternal Grandfather   . Stroke Paternal Grandfather   . Heart attack Paternal Grandfather   . Cancer Paternal Grandmother        Breast  . Diabetes Maternal Grandfather   . Hypertension Maternal Grandfather   . Thyroid disease Father   . Hypertension Father   . Spondylolisthesis Mother   . Clotting disorder Mother        PE, DVT on Eliquis   . Asthma Brother     The following portions of the patient's history were reviewed and updated as appropriate:  allergies, current medications, past family history, past medical history, past social history, past surgical history and problem list.  Review of Systems Pertinent items are noted in HPI.   Objective:  BP 119/73   Pulse 78   Ht 5' 6.5" (1.689 m)   Wt 215 lb (97.5 kg)   BMI 34.18 kg/m  CONSTITUTIONAL: Well-developed, well-nourished female in no acute distress.  HENT:  Normocephalic, atraumatic, External right and left ear normal. Oropharynx is clear and moist EYES: Conjunctivae and EOM are normal. Pupils are equal, round, and reactive to light. No scleral icterus.  NECK: Normal range of motion, supple, no masses.  Normal thyroid.  SKIN: Skin is warm and dry. No rash noted. Not diaphoretic. No erythema. No pallor. NEUROLOGIC: Alert and oriented to person, place, and time. Normal reflexes, muscle tone coordination. No cranial nerve deficit noted. PSYCHIATRIC: Normal mood and affect. Normal behavior. Normal judgment and thought content. CARDIOVASCULAR: Normal heart rate noted, regular rhythm RESPIRATORY: Clear to auscultation bilaterally. Effort and breath sounds normal, no problems with respiration noted. BREASTS: Symmetric in size. No masses, skin changes, nipple drainage, or lymphadenopathy. Bilateral nipple rings in palce ABDOMEN: Soft, normal bowel sounds, no distention noted.  No tenderness, rebound or guarding.  PELVIC: normal appearing external genitalia; normal appearing vaginal mucosa and cervix.  No abnormal discharge noted.  Pap smear obtained.  Normal uterine size, no other palpable masses, no uterine or adnexal tenderness. MUSCULOSKELETAL: Normal range of motion. No tenderness.  No cyanosis, clubbing, or edema.  2+ distal pulses.   Assessment and Plan:   1. Well woman exam Benign exam  2. Routine screening for STI (sexually transmitted infection) - Cytology - PAP( [facility]) - HIV Antibody (routine testing w rflx) - RPR - Hepatitis B surface antigen - Hepatitis C  Antibody  3. Cervical cancer screening - Cytology - PAP( [facility])  4. Obesity (BMI 30-39.9) Trying to lose weight - Referral to [facility]  5. Encounter for counseling regarding contraception - Reviewed options for contraception, that stopping depo would likely aid in weight loss - gave info for LARCs - she will consider   Will follow up results of pap smear/STI screen and manage accordingly. Encouraged improvement in diet and exercise.   Routine preventative health maintenance measures emphasized. Please refer to After Visit Summary for other counseling recommendations.     Baldemar Lenis, M.D. Attending [facility])

## 2018-11-05 LAB — CYTOLOGY - PAP
Chlamydia: NEGATIVE
Diagnosis: NEGATIVE
Neisseria Gonorrhea: NEGATIVE
Trichomonas: NEGATIVE

## 2018-11-05 LAB — RPR: RPR Ser Ql: NONREACTIVE

## 2018-11-05 LAB — HEPATITIS C ANTIBODY: Hep C Virus Ab: <0.1 s/co ratio (ref 0.0–0.9)

## 2018-11-05 LAB — HEPATITIS B SURFACE ANTIGEN: Hepatitis B Surface Ag: NEGATIVE

## 2018-11-05 LAB — HIV ANTIBODY (ROUTINE TESTING W REFLEX): HIV Screen 4th Generation wRfx: NONREACTIVE

## 2018-11-05 MED ORDER — TERCONAZOLE 0.8 % VA CREA: 1.0000 | TOPICAL_CREAM | Freq: Every day | VAGINAL | 0 refills | Status: DC

## 2018-11-05 NOTE — Addendum Note (Signed)
Addended by: Leroy Libman on: 11/05/2018 09:57 PM   Modules accepted: Orders

## 2018-11-11 ENCOUNTER — Ambulatory Visit: Payer: 59 | Admitting: Skilled Nursing Facility1

## 2018-11-22 ENCOUNTER — Ambulatory Visit (INDEPENDENT_AMBULATORY_CARE_PROVIDER_SITE_OTHER): Payer: Managed Care, Other (non HMO) | Admitting: Family Medicine

## 2018-11-22 ENCOUNTER — Ambulatory Visit (INDEPENDENT_AMBULATORY_CARE_PROVIDER_SITE_OTHER): Payer: 59

## 2018-11-22 ENCOUNTER — Encounter: Payer: Self-pay | Admitting: Family Medicine

## 2018-11-22 ENCOUNTER — Other Ambulatory Visit: Payer: Self-pay

## 2018-11-22 VITALS — BP 120/80 | HR 68 | Temp 98.4°F | Resp 16 | Wt 207.8 lb

## 2018-11-22 VITALS — BP 129/80

## 2018-11-22 DIAGNOSIS — R1010 Upper abdominal pain, unspecified: Secondary | ICD-10-CM

## 2018-11-22 DIAGNOSIS — K219 Gastro-esophageal reflux disease without esophagitis: Secondary | ICD-10-CM | POA: Diagnosis not present

## 2018-11-22 DIAGNOSIS — Z3042 Encounter for surveillance of injectable contraceptive: Secondary | ICD-10-CM

## 2018-11-22 MED ORDER — MEDROXYPROGESTERONE ACETATE 150 MG/ML IM SUSP
150.0000 mg | Freq: Once | INTRAMUSCULAR | Status: AC
  Administered 2018-11-22: 150 mg via INTRAMUSCULAR

## 2018-11-22 NOTE — Patient Instructions (Signed)
Try the omeprazole 30 minutes before your first meal of the day.  Avoid eating large amounts of food. Eat small frequent meals.   Try to avoid eating and laying down.   If your symptoms are not improving with the medication or if you think you are getting worse, let me know.    Gastroesophageal Reflux Disease, Adult Gastroesophageal reflux (GER) happens when acid from the stomach flows up into the tube that connects the mouth and the stomach (esophagus). Normally, food travels down the esophagus and stays in the stomach to be digested. With GER, food and stomach acid sometimes move back up into the esophagus. You may have a disease called gastroesophageal reflux disease (GERD) if the reflux:  Happens often.  Causes frequent or very bad symptoms.  Causes problems such as damage to the esophagus. When this happens, the esophagus becomes sore and swollen (inflamed). Over time, GERD can make small holes (ulcers) in the lining of the esophagus. What are the causes? This condition is caused by a problem with the muscle between the esophagus and the stomach. When this muscle is weak or not normal, it does not close properly to keep food and acid from coming back up from the stomach. The muscle can be weak because of:  Tobacco use.  Pregnancy.  Having a certain type of hernia (hiatal hernia).  Alcohol use.  Certain foods and drinks, such as coffee, chocolate, onions, and peppermint. What increases the risk? You are more likely to develop this condition if you:  Are overweight.  Have a disease that affects your connective tissue.  Use NSAID medicines. What are the signs or symptoms? Symptoms of this condition include:  Heartburn.  Difficult or painful swallowing.  The feeling of having a lump in the throat.  A bitter taste in the mouth.  Bad breath.  Having a lot of saliva.  Having an upset or bloated stomach.  Belching.  Chest pain. Different conditions can cause chest  pain. Make sure you see your doctor if you have chest pain.  Shortness of breath or noisy breathing (wheezing).  Ongoing (chronic) cough or a cough at night.  Wearing away of the surface of teeth (tooth enamel).  Weight loss. How is this treated? Treatment will depend on how bad your symptoms are. Your doctor may suggest:  Changes to your diet.  Medicine.  Surgery. Follow these instructions at home: Eating and drinking   Follow a diet as told by your doctor. You may need to avoid foods and drinks such as: ? Coffee and tea (with or without caffeine). ? Drinks that contain alcohol. ? Energy drinks and sports drinks. ? Bubbly (carbonated) drinks or sodas. ? Chocolate and cocoa. ? Peppermint and mint flavorings. ? Garlic and onions. ? Horseradish. ? Spicy and acidic foods. These include peppers, chili powder, curry powder, vinegar, hot sauces, and BBQ sauce. ? Citrus fruit juices and citrus fruits, such as oranges, lemons, and limes. ? Tomato-based foods. These include red sauce, chili, salsa, and pizza with red sauce. ? Fried and fatty foods. These include donuts, french fries, potato chips, and high-fat dressings. ? High-fat meats. These include hot dogs, rib eye steak, sausage, ham, and bacon. ? High-fat dairy items, such as whole milk, butter, and cream cheese.  Eat small meals often. Avoid eating large meals.  Avoid drinking large amounts of liquid with your meals.  Avoid eating meals during the 2-3 hours before bedtime.  Avoid lying down right after you eat.  Do  not exercise right after you eat. Lifestyle   Do not use any products that contain nicotine or tobacco. These include cigarettes, e-cigarettes, and chewing tobacco. If you need help quitting, ask your doctor.  Try to lower your stress. If you need help doing this, ask your doctor.  If you are overweight, lose an amount of weight that is healthy for you. Ask your doctor about a safe weight loss goal.  General instructions  Pay attention to any changes in your symptoms.  Take over-the-counter and prescription medicines only as told by your doctor. Do not take aspirin, ibuprofen, or other NSAIDs unless your doctor says it is okay.  Wear loose clothes. Do not wear anything tight around your waist.  Raise (elevate) the head of your bed about 6 inches (15 cm).  Avoid bending over if this makes your symptoms worse.  Keep all follow-up visits as told by your doctor. This is important. Contact a doctor if:  You have new symptoms.  You lose weight and you do not know why.  You have trouble swallowing or it hurts to swallow.  You have wheezing or a cough that keeps happening.  Your symptoms do not get better with treatment.  You have a hoarse voice. Get help right away if:  You have pain in your arms, neck, jaw, teeth, or back.  You feel sweaty, dizzy, or light-headed.  You have chest pain or shortness of breath.  You throw up (vomit) and your throw-up looks like blood or coffee grounds.  You pass out (faint).  Your poop (stool) is bloody or black.  You cannot swallow, drink, or eat. Summary  If a person has gastroesophageal reflux disease (GERD), food and stomach acid move back up into the esophagus and cause symptoms or problems such as damage to the esophagus.  Treatment will depend on how bad your symptoms are.  Follow a diet as told by your doctor.  Take all medicines only as told by your doctor. This information is not intended to replace advice given to you by your health care provider. Make sure you discuss any questions you have with your health care provider. Document Released: 02/11/2008 Document Revised: 03/03/2018 Document Reviewed: 03/03/2018 Elsevier Interactive Patient Education  2019 [employer].

## 2018-11-22 NOTE — Progress Notes (Signed)
Nurse visit for pt supply Depo. Pt is on time for inj.  Depo given LD without complaints. Next Depo due June 1-15, pt agrees.

## 2018-11-22 NOTE — Progress Notes (Signed)
   Subjective:    Patient ID: Ellen Mooney, female    DOB: 09/04/1991, 28 y.o.   MRN: [id number]  HPI Chief Complaint  Patient presents with  . stomach pain    stomach pain, march 6th. mild diarrhea, loss of appetite., dull pain in middle top of stomach.    She is a 28 year old female with a history of GERD who is here with complaints of a 10 day history of intermittent upper abdominal pain that starts approximately 30 minutes to one hour after eating. Pain is dull, non radiating and lasts for a couple of hours typically.  Also has more gas than usual.  Bowel movements are looser and green at times. No blood.   She last ate at 3:30 am, ate a salad with avacado and had upper abdominal pain for one hour. The pain resolved.  She works nights and eats before going to bed in the mornings.   Denies fever, chills, chest pain, palpitations, N/V.   Healthy diet with more fruit, salads. Trying to lose weight and has lost 9 lbs.   Sates she has eaten fried foods and foods high in fat and no pain with these foods.   No alcohol. No NSAIDs.   LMP: not since Depo Provera  Last Depo Provera injection 3 months ago. She plans to get this today.   Sexually active.   Reviewed allergies, medications, past medical, surgical, family, and social history.   Review of Systems Pertinent positives and negatives in the history of present illness.     Objective:   Physical Exam BP 120/80   Pulse 68   Temp 98.4 F (36.9 C) (Oral)   Resp 16   Wt 207 lb 12.8 oz (94.3 kg)   SpO2 98%   BMI 33.04 kg/m   Alert and in no distress.  Pharyngeal area is normal. Neck is supple without adenopathy or thyromegaly. Cardiac exam shows a regular sinus rhythm without murmurs or gallops. Lungs are clear to auscultation. Abdomen soft, non distended, normal BS, non tender, no guarding or rebound, no palpable masses. Skin is warm and dry.       Assessment & Plan:  Gastroesophageal reflux disease, esophagitis  presence not specified  Upper abdominal pain  Symptoms appear to be related to GERD and not gallbladder. Benign abdominal exam, no red flag symptoms. Try omeprazole, she has this at home. Discussed lifestyle modifications for GERD management. Follow up if worsening or no improvement in 4 weeks. Consider GI referral.

## 2018-11-24 NOTE — Progress Notes (Signed)
I have reviewed the chart and agree with nursing staff's documentation of this patient's encounter.  Francyne Arreaga, MD 11/24/2018 1:25 PM    

## 2019-02-01 ENCOUNTER — Telehealth: Payer: Self-pay

## 2019-02-01 NOTE — Telephone Encounter (Signed)
Pt was called to make an appointment for a CPE with Vickie. No answer lvm. KH

## 2019-02-07 ENCOUNTER — Ambulatory Visit: Payer: 59

## 2019-02-10 ENCOUNTER — Other Ambulatory Visit: Payer: Self-pay

## 2019-02-10 DIAGNOSIS — Z3042 Encounter for surveillance of injectable contraceptive: Secondary | ICD-10-CM

## 2019-02-10 MED ORDER — MEDROXYPROGESTERONE ACETATE 150 MG/ML IM SUSP: 150.0000 mg | INTRAMUSCULAR | 3 refills | Status: DC

## 2019-02-10 NOTE — Progress Notes (Signed)
Rx refilled due to COVID -19 pt has appt upcoming for Depo injection.  Pt notified.

## 2019-02-14 ENCOUNTER — Other Ambulatory Visit: Payer: Self-pay

## 2019-02-14 ENCOUNTER — Ambulatory Visit (INDEPENDENT_AMBULATORY_CARE_PROVIDER_SITE_OTHER): Payer: 59

## 2019-02-14 DIAGNOSIS — Z3042 Encounter for surveillance of injectable contraceptive: Secondary | ICD-10-CM | POA: Diagnosis not present

## 2019-02-14 MED ORDER — MEDROXYPROGESTERONE ACETATE 150 MG/ML IM SUSP
150.0000 mg | Freq: Once | INTRAMUSCULAR | Status: AC
  Administered 2019-02-14: 150 mg via INTRAMUSCULAR

## 2019-02-14 NOTE — Progress Notes (Signed)
Pt is here for depo injection. She is on time for injection. Inj given in L arm without difficulty. Next injection due Aug 24-Sept 7.

## 2019-05-02 ENCOUNTER — Encounter: Payer: Managed Care, Other (non HMO) | Admitting: Family Medicine

## 2019-05-13 ENCOUNTER — Ambulatory Visit (INDEPENDENT_AMBULATORY_CARE_PROVIDER_SITE_OTHER): Payer: Self-pay | Admitting: *Deleted

## 2019-05-13 ENCOUNTER — Other Ambulatory Visit: Payer: Self-pay

## 2019-05-13 VITALS — BP 120/73 | HR 77 | Temp 98.9°F | Ht 66.5 in | Wt 210.5 lb

## 2019-05-13 DIAGNOSIS — Z3042 Encounter for surveillance of injectable contraceptive: Secondary | ICD-10-CM

## 2019-05-13 MED ORDER — MEDROXYPROGESTERONE ACETATE 150 MG/ML IM SUSP
150.0000 mg | Freq: Once | INTRAMUSCULAR | Status: AC
  Administered 2019-05-13: 150 mg via INTRAMUSCULAR

## 2019-05-13 NOTE — Progress Notes (Signed)
   Subjective:  Pt in for Depo Provera injection.    Objective: Need for contraception. No unusual complaints. Last Depo Provera given 02/14/2019. Annual exam/PAP completed 11/04/2018.   Assessment: Pt tolerated Depo injection. Depo given Right Deltoid.  Plan:  Next injection due July 29, 2019-August 12, 2019.    Derl Barrow, RN

## 2019-07-18 ENCOUNTER — Encounter: Payer: Managed Care, Other (non HMO) | Admitting: Family Medicine

## 2019-08-02 ENCOUNTER — Ambulatory Visit (INDEPENDENT_AMBULATORY_CARE_PROVIDER_SITE_OTHER): Payer: Managed Care, Other (non HMO)

## 2019-08-02 ENCOUNTER — Other Ambulatory Visit: Payer: Self-pay

## 2019-08-02 DIAGNOSIS — Z3042 Encounter for surveillance of injectable contraceptive: Secondary | ICD-10-CM | POA: Diagnosis not present

## 2019-08-02 DIAGNOSIS — N898 Other specified noninflammatory disorders of vagina: Secondary | ICD-10-CM | POA: Diagnosis not present

## 2019-08-02 DIAGNOSIS — Z113 Encounter for screening for infections with a predominantly sexual mode of transmission: Secondary | ICD-10-CM

## 2019-08-02 MED ORDER — MEDROXYPROGESTERONE ACETATE 150 MG/ML IM SUSP
150.0000 mg | INTRAMUSCULAR | Status: AC
  Administered 2019-08-02 – 2024-08-30 (×8): 150 mg via INTRAMUSCULAR

## 2019-08-02 NOTE — Progress Notes (Signed)
Pt is in the office for depo injection and std testing. Administered in L Del and pt tolerated well. Next due Feb 9-23. Pt reports vaginal d/c. .. Administrations This Visit    medroxyPROGESTERone (DEPO-PROVERA) injection 150 mg    Admin Date 08/02/2019 Action Given Dose 150 mg Route Intramuscular Administered By Hinton Lovely, RN

## 2019-08-02 NOTE — Progress Notes (Signed)
Agree with A & P. 

## 2019-08-03 LAB — HEPATITIS B SURFACE ANTIGEN: Hepatitis B Surface Ag: NEGATIVE

## 2019-08-03 LAB — CERVICOVAGINAL ANCILLARY ONLY
Bacterial Vaginitis (gardnerella): NEGATIVE
Candida Glabrata: NEGATIVE
Candida Vaginitis: NEGATIVE
Chlamydia: NEGATIVE
Comment: NEGATIVE
Comment: NEGATIVE
Comment: NEGATIVE
Comment: NEGATIVE
Comment: NEGATIVE
Comment: NORMAL
Neisseria Gonorrhea: NEGATIVE
Trichomonas: NEGATIVE

## 2019-08-03 LAB — HIV ANTIBODY (ROUTINE TESTING W REFLEX): HIV Screen 4th Generation wRfx: NONREACTIVE

## 2019-08-03 LAB — RPR: RPR Ser Ql: NONREACTIVE

## 2019-08-03 LAB — HEPATITIS C ANTIBODY: Hep C Virus Ab: <0.1 s/co ratio (ref 0.0–0.9)

## 2019-09-27 ENCOUNTER — Other Ambulatory Visit: Payer: Self-pay

## 2019-09-27 ENCOUNTER — Encounter: Payer: Self-pay | Admitting: Family Medicine

## 2019-09-27 ENCOUNTER — Ambulatory Visit: Payer: Managed Care, Other (non HMO) | Admitting: Family Medicine

## 2019-09-27 VITALS — BP 140/94 | HR 70 | Temp 97.8°F | Ht 67.0 in | Wt 199.4 lb

## 2019-09-27 DIAGNOSIS — E669 Obesity, unspecified: Secondary | ICD-10-CM

## 2019-09-27 DIAGNOSIS — Z0000 Encounter for general adult medical examination without abnormal findings: Secondary | ICD-10-CM | POA: Diagnosis not present

## 2019-09-27 DIAGNOSIS — Z8349 Family history of other endocrine, nutritional and metabolic diseases: Secondary | ICD-10-CM | POA: Insufficient documentation

## 2019-09-27 DIAGNOSIS — Z8249 Family history of ischemic heart disease and other diseases of the circulatory system: Secondary | ICD-10-CM | POA: Insufficient documentation

## 2019-09-27 DIAGNOSIS — R7989 Other specified abnormal findings of blood chemistry: Secondary | ICD-10-CM | POA: Insufficient documentation

## 2019-09-27 DIAGNOSIS — R030 Elevated blood-pressure reading, without diagnosis of hypertension: Secondary | ICD-10-CM | POA: Insufficient documentation

## 2019-09-27 NOTE — Progress Notes (Signed)
Subjective:    Patient ID: Ellen Mooney, female    DOB: 12-28-1990, 29 y.o.   MRN: [id number]  HPI Chief Complaint  Patient presents with  . cpe    fasting cpe , bp has been elevated some.    She is here for a complete physical exam. Brought in recent lab results done through [facility]. Elevated serum creatinine. Hgb A1c 4.8%   Elevated BP for the past few weeks without a history of HTN. States several family members have HTN.   Requests thyroid panel due to father with thyroid disease.   Other providers: OB/GYN- Dr. 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 Has a plan to go to graduate school at some point. Degrees in psychology and criminal justice.   Former vaper, drinks alcohol regularly, denies drug use  Diet: healthy  Excerise: walks several miles at work   Immunizations: declines flu shot   Health maintenance:  Mammogram: N/A Colonoscopy: N/A Last Gynecological Exam: 10/2018 Contraception: on Depo Provera Last Dental Exam: in the past month  Last Eye Exam: 06/2019  Wears seatbelt always, smoke detectors in home and functioning, does not text while driving and feels safe in home environment.   Reviewed allergies, medications, past medical, surgical, family, and social history.    Review of Systems Review of Systems Constitutional: -fever, -chills, -sweats, -unexpected weight change,-fatigue ENT: -runny nose, -ear pain, -sore throat Cardiology:  -chest pain, -palpitations, -edema Respiratory: -cough, -shortness of breath, -wheezing Gastroenterology: -abdominal pain, -nausea, -vomiting, -diarrhea, -constipation  Hematology: -bleeding or bruising problems Musculoskeletal: -arthralgias, -myalgias, -joint swelling, -back pain Ophthalmology: -vision changes Urology: -dysuria, -difficulty urinating, -hematuria, -urinary frequency, -urgency Neurology: -headache, -weakness, -tingling, -numbness       Objective:   Physical  Exam BP (!) 140/94   Pulse 70   Temp 97.8 F (36.6 C)   Ht 5\' 7"  (1.702 m)   Wt 199 lb 6.4 oz (90.4 kg)   BMI 31.23 kg/m   General Appearance:    Alert, cooperative, no distress, appears stated age  Head:    Normocephalic, without obvious abnormality, atraumatic  Eyes:    PERRL, conjunctiva/corneas clear, EOM's intact  Ears:    Normal TM's and external ear canals  Nose:   Mask in place   Throat:   Mask   Neck:   Supple, no lymphadenopathy;  thyroid:  no   enlargement/tenderness/nodules; no JVD  Back:    Spine nontender, no curvature, ROM normal, no CVA     tenderness  Lungs:     Clear to auscultation bilaterally without wheezes, rales or     ronchi; respirations unlabored  Chest Wall:    No tenderness or deformity   Heart:    Regular rate and rhythm, S1 and S2 normal, no murmur, rub   or gallop  Breast Exam:    OB/GYN  Abdomen:     Soft, non-tender, nondistended, normoactive bowel sounds,    no masses, no hepatosplenomegaly  Genitalia:    OB/GYN  Rectal:    Not performed due to age<40 and no related complaints  Extremities:   No clubbing, cyanosis or edema  Pulses:   2+ and symmetric all extremities  Skin:   Skin color, texture, turgor normal, no rashes or lesions  Lymph nodes:   Cervical, supraclavicular, and axillary nodes normal  Neurologic:   CNII-XII intact, normal strength, sensation and gait; reflexes 2+ and symmetric throughout  Psych:   Normal mood, affect, hygiene and grooming.         Assessment & Plan:  Routine general medical examination at a health care facility - Plan: CBC with Differential/Platelet, Comprehensive metabolic panel, TSH, T4, free, T3 -Here today for CPE.  Discussed preventive healthcare.  She sees her OB/GYN.  Immunizations reviewed and she declines flu shot.  Family history of thyroid disease in father - Plan: TSH, T4, free, T3 -Check thyroid function and follow-up  Elevated serum creatinine - Plan: Comprehensive metabolic  panel -This may have been merely related to dehydration at the time of her labs.  Recheck  Elevated BP without diagnosis of hypertension -Recommend she continue to keep an eye on her blood pressure and follow-up in 4 weeks.  She will bring in her blood pressure machine and readings.  Counseling on low-sodium diet.  DASH diet handout provided.  Family history of hypertension  Obesity (BMI 30-39.9) -Encouraged a 5 pound weight loss goal by eating a healthy diet and exercising.

## 2019-09-27 NOTE — Patient Instructions (Signed)
Goal BP is 130/80 or less.   Keep an eye on your BP at home and come in with your machine and readings in 4 weeks.   Pay attention to sodium in your diet.  No more than 2,300 mg per day.    DASH Eating Plan DASH stands for "Dietary Approaches to Stop Hypertension." The DASH eating plan is a healthy eating plan that has been shown to reduce high blood pressure (hypertension). It may also reduce your risk for type 2 diabetes, heart disease, and stroke. The DASH eating plan may also help with weight loss. What are tips for following this plan?  General guidelines  Avoid eating more than 2,300 mg (milligrams) of salt (sodium) a day. If you have hypertension, you may need to reduce your sodium intake to 1,500 mg a day.  Limit alcohol intake to no more than 1 drink a day for nonpregnant women and 2 drinks a day for men. One drink equals 12 oz of beer, 5 oz of wine, or 1 oz of hard liquor.  Work with your health care provider to maintain a healthy body weight or to lose weight. Ask what an ideal weight is for you.  Get at least 30 minutes of exercise that causes your heart to beat faster (aerobic exercise) most days of the week. Activities may include walking, swimming, or biking.  Work with your health care provider or diet and nutrition specialist (dietitian) to adjust your eating plan to your individual calorie needs. Reading food labels   Check food labels for the amount of sodium per serving. Choose foods with less than 5 percent of the Daily Value of sodium. Generally, foods with less than 300 mg of sodium per serving fit into this eating plan.  To find whole grains, look for the word "whole" as the first word in the ingredient list. Shopping  Buy products labeled as "low-sodium" or "no salt added."  Buy fresh foods. Avoid canned foods and premade or frozen meals. Cooking  Avoid adding salt when cooking. Use salt-free seasonings or herbs instead of table salt or sea salt. Check  with your health care provider or pharmacist before using salt substitutes.  Do not fry foods. Cook foods using healthy methods such as baking, boiling, grilling, and broiling instead.  Cook with heart-healthy oils, such as olive, canola, soybean, or sunflower oil. Meal planning  Eat a balanced diet that includes: ? 5 or more servings of fruits and vegetables each day. At each meal, try to fill half of your plate with fruits and vegetables. ? Up to 6-8 servings of whole grains each day. ? Less than 6 oz of lean meat, poultry, or fish each day. A 3-oz serving of meat is about the same size as a deck of cards. One egg equals 1 oz. ? 2 servings of low-fat dairy each day. ? A serving of nuts, seeds, or beans 5 times each week. ? Heart-healthy fats. Healthy fats called Omega-3 fatty acids are found in foods such as flaxseeds and coldwater fish, like sardines, salmon, and mackerel.  Limit how much you eat of the following: ? Canned or prepackaged foods. ? Food that is high in trans fat, such as fried foods. ? Food that is high in saturated fat, such as fatty meat. ? Sweets, desserts, sugary drinks, and other foods with added sugar. ? Full-fat dairy products.  Do not salt foods before eating.  Try to eat at least 2 vegetarian meals each week.  Eat more  home-cooked food and less restaurant, buffet, and fast food.  When eating at a restaurant, ask that your food be prepared with less salt or no salt, if possible. What foods are recommended? The items listed may not be a complete list. Talk with your dietitian about what dietary choices are best for you. Grains Whole-grain or whole-wheat bread. Whole-grain or whole-wheat pasta. Brown rice. [place]. Bulgur. Whole-grain and low-sodium cereals. Pita bread. Low-fat, low-sodium crackers. Whole-wheat flour tortillas. Vegetables Fresh or frozen vegetables (raw, steamed, roasted, or grilled). Low-sodium or reduced-sodium tomato and vegetable  juice. Low-sodium or reduced-sodium tomato sauce and tomato paste. Low-sodium or reduced-sodium canned vegetables. Fruits All fresh, dried, or frozen fruit. Canned fruit in natural juice (without added sugar). Meat and other protein foods Skinless chicken or [place]. Ground chicken or [place]. Pork with fat trimmed off. Fish and seafood. Egg whites. Dried beans, peas, or lentils. Unsalted nuts, nut butters, and seeds. Unsalted canned beans. Lean cuts of beef with fat trimmed off. Low-sodium, lean deli meat. Dairy Low-fat (1%) or fat-free (skim) milk. Fat-free, low-fat, or reduced-fat cheeses. Nonfat, low-sodium ricotta or cottage cheese. Low-fat or nonfat yogurt. Low-fat, low-sodium cheese. Fats and oils Soft margarine without trans fats. Vegetable oil. Low-fat, reduced-fat, or light mayonnaise and salad dressings (reduced-sodium). Canola, safflower, olive, soybean, and sunflower oils. Avocado. Seasoning and other foods Herbs. Spices. Seasoning mixes without salt. Unsalted popcorn and pretzels. Fat-free sweets. What foods are not recommended? The items listed may not be a complete list. Talk with your dietitian about what dietary choices are best for you. Grains Baked goods made with fat, such as croissants, muffins, or some breads. Dry pasta or rice meal packs. Vegetables Creamed or fried vegetables. Vegetables in a cheese sauce. Regular canned vegetables (not low-sodium or reduced-sodium). Regular canned tomato sauce and paste (not low-sodium or reduced-sodium). Regular tomato and vegetable juice (not low-sodium or reduced-sodium). Angie Fava. Olives. Fruits Canned fruit in a light or heavy syrup. Fried fruit. Fruit in cream or butter sauce. Meat and other protein foods Fatty cuts of meat. Ribs. Fried meat. Berniece Salines. Sausage. Bologna and other processed lunch meats. Salami. Fatback. Hotdogs. Bratwurst. Salted nuts and seeds. Canned beans with added salt. Canned or smoked fish. Whole eggs or egg yolks.  Chicken or [place] with skin. Dairy Whole or 2% milk, cream, and half-and-half. Whole or full-fat cream cheese. Whole-fat or sweetened yogurt. Full-fat cheese. Nondairy creamers. Whipped toppings. Processed cheese and cheese spreads. Fats and oils Butter. Stick margarine. Lard. Shortening. Ghee. Bacon fat. Tropical oils, such as coconut, palm kernel, or palm oil. Seasoning and other foods Salted popcorn and pretzels. Onion salt, garlic salt, seasoned salt, table salt, and sea salt. Worcestershire sauce. Tartar sauce. Barbecue sauce. Teriyaki sauce. Soy sauce, including reduced-sodium. Steak sauce. Canned and packaged gravies. Fish sauce. Oyster sauce. Cocktail sauce. Horseradish that you find on the shelf. Ketchup. Mustard. Meat flavorings and tenderizers. Bouillon cubes. Hot sauce and Tabasco sauce. Premade or packaged marinades. Premade or packaged taco seasonings. Relishes. Regular salad dressings. Where to find more information:  National Heart, Lung, and [place]: https://wilson-eaton.com/  American Heart Association: www.heart.org Summary  The DASH eating plan is a healthy eating plan that has been shown to reduce high blood pressure (hypertension). It may also reduce your risk for type 2 diabetes, heart disease, and stroke.  With the DASH eating plan, you should limit salt (sodium) intake to 2,300 mg a day. If you have hypertension, you may need to reduce your sodium intake to 1,500  mg a day.  When on the DASH eating plan, aim to eat more fresh fruits and vegetables, whole grains, lean proteins, low-fat dairy, and heart-healthy fats.  Work with your health care provider or diet and nutrition specialist (dietitian) to adjust your eating plan to your individual calorie needs. This information is not intended to replace advice given to you by your health care provider. Make sure you discuss any questions you have with your health care provider. Document Revised: 08/07/2017 Document Reviewed:  08/18/2016 Elsevier Patient Education  2020 [place] 71-50 Years Old, Female Preventive care refers to visits with your health care provider and lifestyle choices that can promote health and wellness. This includes:  A yearly physical exam. This may also be called an annual well check.  Regular dental visits and eye exams.  Immunizations.  Screening for certain conditions.  Healthy lifestyle choices, such as eating a healthy diet, getting regular exercise, not using drugs or products that contain nicotine and tobacco, and limiting alcohol use. What can I expect for my preventive care visit? Physical exam Your health care provider will check your:  Height and weight. This may be used to calculate body mass index (BMI), which tells if you are at a healthy weight.  Heart rate and blood pressure.  Skin for abnormal spots. Counseling Your health care provider may ask you questions about your:  Alcohol, tobacco, and drug use.  Emotional well-being.  Home and relationship well-being.  Sexual activity.  Eating habits.  Work and work Statistician.  Method of birth control.  Menstrual cycle.  Pregnancy history. What immunizations do I need?  Influenza (flu) vaccine  This is recommended every year. Tetanus, diphtheria, and pertussis (Tdap) vaccine  You may need a Td booster every 10 years. Varicella (chickenpox) vaccine  You may need this if you have not been vaccinated. Human papillomavirus (HPV) vaccine  If recommended by your health care provider, you may need three doses over 6 months. Measles, mumps, and rubella (MMR) vaccine  You may need at least one dose of MMR. You may also need a second dose. Meningococcal conjugate (MenACWY) vaccine  One dose is recommended if you are age 38-21 years and a first-year college student living in a residence hall, or if you have one of several medical conditions. You may also need additional booster  doses. Pneumococcal conjugate (PCV13) vaccine  You may need this if you have certain conditions and were not previously vaccinated. Pneumococcal polysaccharide (PPSV23) vaccine  You may need one or two doses if you smoke cigarettes or if you have certain conditions. Hepatitis A vaccine  You may need this if you have certain conditions or if you travel or work in places where you may be exposed to hepatitis A. Hepatitis B vaccine  You may need this if you have certain conditions or if you travel or work in places where you may be exposed to hepatitis B. Haemophilus influenzae type b (Hib) vaccine  You may need this if you have certain conditions. You may receive vaccines as individual doses or as more than one vaccine together in one shot (combination vaccines). Talk with your health care provider about the risks and benefits of combination vaccines. What tests do I need?  Blood tests  Lipid and cholesterol levels. These may be checked every 5 years starting at age 53.  Hepatitis C test.  Hepatitis B test. Screening  Diabetes screening. This is done by checking your blood sugar (glucose)  after you have not eaten for a while (fasting).  Sexually transmitted disease (STD) testing.  BRCA-related cancer screening. This may be done if you have a family history of breast, ovarian, tubal, or peritoneal cancers.  Pelvic exam and Pap test. This may be done every 3 years starting at age 21. Starting at age 31, this may be done every 5 years if you have a Pap test in combination with an HPV test. Talk with your health care provider about your test results, treatment options, and if necessary, the need for more tests. Follow these instructions at home: Eating and drinking   Eat a diet that includes fresh fruits and vegetables, whole grains, lean protein, and low-fat dairy.  Take vitamin and mineral supplements as recommended by your health care provider.  Do not drink alcohol  if: ? Your health care provider tells you not to drink. ? You are pregnant, may be pregnant, or are planning to become pregnant.  If you drink alcohol: ? Limit how much you have to 0-1 drink a day. ? Be aware of how much alcohol is in your drink. In the [place] one drink equals one 12 oz bottle of beer (355 mL), one 5 oz glass of wine (148 mL), or one 1 oz glass of hard liquor (44 mL). Lifestyle  Take daily care of your teeth and gums.  Stay active. Exercise for at least 30 minutes on 5 or more days each week.  Do not use any products that contain nicotine or tobacco, such as cigarettes, e-cigarettes, and chewing tobacco. If you need help quitting, ask your health care provider.  If you are sexually active, practice safe sex. Use a condom or other form of birth control (contraception) in order to prevent pregnancy and STIs (sexually transmitted infections). If you plan to become pregnant, see your health care provider for a preconception visit. What's next?  Visit your health care provider once a year for a well check visit.  Ask your health care provider how often you should have your eyes and teeth checked.  Stay up to date on all vaccines. This information is not intended to replace advice given to you by your health care provider. Make sure you discuss any questions you have with your health care provider. Document Revised: 05/06/2018 Document Reviewed: 05/06/2018 Elsevier Patient Education  2020 [employer].

## 2019-09-28 LAB — COMPREHENSIVE METABOLIC PANEL
ALT: 10 IU/L (ref 0–32)
AST: 10 IU/L (ref 0–40)
Albumin/Globulin Ratio: 2.2 (ref 1.2–2.2)
Albumin: 4.7 g/dL (ref 3.9–5.0)
Alkaline Phosphatase: 47 IU/L (ref 39–117)
BUN/Creatinine Ratio: 8 — ABNORMAL LOW (ref 9–23)
BUN: 6 mg/dL (ref 6–20)
Bilirubin Total: 0.6 mg/dL (ref 0.0–1.2)
CO2: 22 mmol/L (ref 20–29)
Calcium: 9.7 mg/dL (ref 8.7–10.2)
Chloride: 105 mmol/L (ref 96–106)
Creatinine, Ser: 0.71 mg/dL (ref 0.57–1.00)
GFR calc Af Amer: 133 mL/min/{1.73_m2} (ref >59)
GFR calc non Af Amer: 115 mL/min/{1.73_m2} (ref >59)
Globulin, Total: 2.1 g/dL (ref 1.5–4.5)
Glucose: 81 mg/dL (ref 65–99)
Potassium: 3.8 mmol/L (ref 3.5–5.2)
Sodium: 139 mmol/L (ref 134–144)
Total Protein: 6.8 g/dL (ref 6.0–8.5)

## 2019-09-28 LAB — CBC WITH DIFFERENTIAL/PLATELET
Basophils Absolute: 0.1 10*3/uL (ref 0.0–0.2)
Basos: 1 %
EOS (ABSOLUTE): 0.3 10*3/uL (ref 0.0–0.4)
Eos: 4 %
Hematocrit: 40 % (ref 34.0–46.6)
Hemoglobin: 13 g/dL (ref 11.1–15.9)
Immature Grans (Abs): 0 10*3/uL (ref 0.0–0.1)
Immature Granulocytes: 0 %
Lymphocytes Absolute: 2.3 10*3/uL (ref 0.7–3.1)
Lymphs: 28 %
MCH: 28.2 pg (ref 26.6–33.0)
MCHC: 32.5 g/dL (ref 31.5–35.7)
MCV: 87 fL (ref 79–97)
Monocytes Absolute: 0.6 10*3/uL (ref 0.1–0.9)
Monocytes: 7 %
Neutrophils Absolute: 5.1 10*3/uL (ref 1.4–7.0)
Neutrophils: 60 %
Platelets: 246 10*3/uL (ref 150–450)
RBC: 4.61 x10E6/uL (ref 3.77–5.28)
RDW: 12.1 % (ref 11.7–15.4)
WBC: 8.4 10*3/uL (ref 3.4–10.8)

## 2019-09-28 LAB — TSH: TSH: 0.861 u[IU]/mL (ref 0.450–4.500)

## 2019-09-28 LAB — T3: T3, Total: 106 ng/dL (ref 71–180)

## 2019-09-28 LAB — T4, FREE: Free T4: 1.35 ng/dL (ref 0.82–1.77)

## 2019-10-25 ENCOUNTER — Ambulatory Visit (INDEPENDENT_AMBULATORY_CARE_PROVIDER_SITE_OTHER): Payer: Managed Care, Other (non HMO)

## 2019-10-25 ENCOUNTER — Other Ambulatory Visit: Payer: Self-pay

## 2019-10-25 DIAGNOSIS — Z3042 Encounter for surveillance of injectable contraceptive: Secondary | ICD-10-CM

## 2019-10-25 NOTE — Progress Notes (Signed)
Pt is in the office for depo injection, administered in L [place], per pt request, and pt tolerated well May 4- May 18 .Marland Kitchen Administrations This Visit    medroxyPROGESTERone (DEPO-PROVERA) injection 150 mg    Admin Date 10/25/2019 Action Given Dose 150 mg Route Intramuscular Administered By Katrina Stack, RN

## 2019-11-02 ENCOUNTER — Encounter: Payer: Self-pay | Admitting: Family Medicine

## 2019-11-02 ENCOUNTER — Other Ambulatory Visit: Payer: Self-pay

## 2019-11-02 ENCOUNTER — Ambulatory Visit: Payer: Managed Care, Other (non HMO) | Admitting: Family Medicine

## 2019-11-02 VITALS — BP 128/78 | HR 70 | Temp 97.7°F | Wt 205.6 lb

## 2019-11-02 DIAGNOSIS — R030 Elevated blood-pressure reading, without diagnosis of hypertension: Secondary | ICD-10-CM

## 2019-11-02 DIAGNOSIS — Z8249 Family history of ischemic heart disease and other diseases of the circulatory system: Secondary | ICD-10-CM | POA: Diagnosis not present

## 2019-11-02 NOTE — Progress Notes (Signed)
   Subjective:    Patient ID: Ellen Mooney, female    DOB: Jul 20, 1991, 29 y.o.   MRN: [id number]  HPI Chief Complaint  Patient presents with  . 4 week follow-up    4 week follow-up on htn. ranging 108-129/60-80   She is here to follow up on elevated BP without a hx of HTN. She does have a family hx of HTN.   BP at home has been in normal range.   Labs from 09/27/2019 normal.    Review of Systems Pertinent positives and negatives in the history of present illness.     Objective:   Physical Exam BP 128/78   Pulse 70   Temp 97.7 F (36.5 C)   Wt 205 lb 9.6 oz (93.3 kg)   BMI 32.20 kg/m   Alert and oriented and in no acute distress.  Not otherwise examined.      Assessment & Plan:  Elevated BP without diagnosis of hypertension  Family history of hypertension  Discussed that her blood pressure at home for the past month as well as in the office today is normal.  No medication needed. encouraged healthy diet and exercise to prevent hypertension in the future.

## 2019-11-03 ENCOUNTER — Ambulatory Visit: Payer: Managed Care, Other (non HMO) | Admitting: Family Medicine

## 2020-01-09 ENCOUNTER — Other Ambulatory Visit: Payer: Self-pay

## 2020-01-09 DIAGNOSIS — Z3042 Encounter for surveillance of injectable contraceptive: Secondary | ICD-10-CM

## 2020-01-09 MED ORDER — MEDROXYPROGESTERONE ACETATE 150 MG/ML IM SUSP: 150.0000 mg | INTRAMUSCULAR | 0 refills | Status: DC

## 2020-01-17 ENCOUNTER — Ambulatory Visit (INDEPENDENT_AMBULATORY_CARE_PROVIDER_SITE_OTHER): Payer: Managed Care, Other (non HMO)

## 2020-01-17 ENCOUNTER — Other Ambulatory Visit: Payer: Self-pay

## 2020-01-17 DIAGNOSIS — Z3042 Encounter for surveillance of injectable contraceptive: Secondary | ICD-10-CM | POA: Diagnosis not present

## 2020-01-17 MED ORDER — MEDROXYPROGESTERONE ACETATE 150 MG/ML IM SUSP
150.0000 mg | Freq: Once | INTRAMUSCULAR | Status: AC
  Administered 2020-01-17: 150 mg via INTRAMUSCULAR

## 2020-01-17 NOTE — Progress Notes (Signed)
Ellen Mooney is here for a depo injection.  Pt tolerated injection well in the  RD without complication. -EH/RMA

## 2020-01-17 NOTE — Progress Notes (Signed)
Patient was assessed and managed by nursing staff during this encounter. I have reviewed the chart and agree with the documentation and plan. I have also made any necessary editorial changes.  Catalina Antigua, MD 01/17/2020 1:02 PM

## 2020-02-23 ENCOUNTER — Other Ambulatory Visit: Payer: Self-pay | Admitting: Obstetrics and Gynecology

## 2020-02-23 ENCOUNTER — Other Ambulatory Visit: Payer: Self-pay

## 2020-02-23 ENCOUNTER — Encounter: Payer: Self-pay | Admitting: Obstetrics and Gynecology

## 2020-02-23 ENCOUNTER — Ambulatory Visit (INDEPENDENT_AMBULATORY_CARE_PROVIDER_SITE_OTHER): Payer: Managed Care, Other (non HMO) | Admitting: Obstetrics and Gynecology

## 2020-02-23 VITALS — BP 123/78 | HR 71 | Wt 202.0 lb

## 2020-02-23 DIAGNOSIS — Z3009 Encounter for other general counseling and advice on contraception: Secondary | ICD-10-CM

## 2020-02-23 DIAGNOSIS — Z01419 Encounter for gynecological examination (general) (routine) without abnormal findings: Secondary | ICD-10-CM

## 2020-02-23 DIAGNOSIS — N6310 Unspecified lump in the right breast, unspecified quadrant: Secondary | ICD-10-CM

## 2020-02-23 DIAGNOSIS — N63 Unspecified lump in unspecified breast: Secondary | ICD-10-CM

## 2020-02-23 NOTE — Progress Notes (Signed)
GYNECOLOGY ANNUAL PREVENTATIVE CARE ENCOUNTER NOTE  Subjective:   Ellen Mooney is a 29 y.o. G7P0010 female here for a annual gynecologic exam. Current complaints: right breast lump, was mildly tender last week, only feels when she is laying on her side.  Denies abnormal vaginal bleeding, discharge, pelvic pain, problems with intercourse or other gynecologic concerns.   Happy with depo.  Has not gotten COVID shot.   Gynecologic History No LMP recorded. Patient has had an injection. Contraception: Depo-Provera injections Last Pap: 10/2018. Results were: negative Last mammogram: n/a   Obstetric History OB History  Gravida Para Term Preterm AB Living  1       1    SAB TAB Ectopic Multiple Live Births    1          # Outcome Date GA Lbr Len/2nd Weight Sex Delivery Anes PTL Lv  1 TAB 03/05/18     TAB       Past Medical History:  Diagnosis Date  . Known health problems: none     Past Surgical History:  Procedure Laterality Date  . NO PAST SURGERIES      Current Outpatient Medications on File Prior to Visit  Medication Sig Dispense Refill  . medroxyPROGESTERone (DEPO-PROVERA) 150 MG/ML injection Inject 1 mL (150 mg total) into the muscle every 3 (three) months. Patient needs Annual for additional refills. 1 mL 0  . Multiple Vitamin (MULTI-VITAMIN DAILY PO) Take by mouth.     Current Facility-Administered Medications on File Prior to Visit  Medication Dose Route Frequency Provider Last Rate Last Admin  . medroxyPROGESTERone (DEPO-PROVERA) injection 150 mg  150 mg Intramuscular Q90 days Chancy Milroy, MD   150 mg at 10/25/19 1044    Allergies  Allergen Reactions  . Peanut-Containing Drug Products Anaphylaxis    Social History   Socioeconomic History  . Marital status: Single    Spouse name: Not on file  . Number of children: Not on file  . Years of education: Not on file  . Highest education level: Not on file  Occupational History  . Not on file    Tobacco Use  . Smoking status: Former Smoker    Types: E-cigarettes  . Smokeless tobacco: Never Used  . Tobacco comment: Juuel Vape  Vaping Use  . Vaping Use: Former  Substance and Sexual Activity  . Alcohol use: Yes    Alcohol/week: 6.0 standard drinks    Types: 6 Cans of beer per week    Comment: 6 per week on average. drinks wine occasionally   . Drug use: No  . Sexual activity: Yes    Birth control/protection: None, Injection  Other Topics Concern  . Not on file  Social History Narrative  . Not on file   Social Determinants of Health   Financial Resource Strain:   . Difficulty of Paying Living Expenses:   Food Insecurity:   . Worried About Charity fundraiser in the Last Year:   . Arboriculturist in the Last Year:   Transportation Needs:   . Film/video editor (Medical):   Marland Kitchen Lack of Transportation (Non-Medical):   Physical Activity:   . Days of Exercise per Week:   . Minutes of Exercise per Session:   Stress:   . Feeling of Stress :   Social Connections:   . Frequency of Communication with Friends and Family:   . Frequency of Social Gatherings with Friends and Family:   . Attends  Religious Services:   . Active Member of Clubs or Organizations:   . Attends Banker Meetings:   Marland Kitchen Marital Status:   Intimate Partner Violence:   . Fear of Current or Ex-Partner:   . Emotionally Abused:   Marland Kitchen Physically Abused:   . Sexually Abused:     Family History  Problem Relation Age of Onset  . Diabetes Paternal Grandfather   . Hypertension Paternal Grandfather   . Stroke Paternal Grandfather   . Heart attack Paternal Grandfather   . Cancer Paternal Grandmother        Breast  . Hypertension Maternal Grandmother   . Diabetes Maternal Grandfather   . Hypertension Maternal Grandfather   . Thyroid disease Father   . Hypertension Father   . Spondylolisthesis Mother   . Clotting disorder Mother        PE, DVT on Eliquis   . Asthma Brother     Diet:  improved diet and walking more Exercise: walking more  The following portions of the patient's history were reviewed and updated as appropriate: allergies, current medications, past family history, past medical history, past social history, past surgical history and problem list.  Review of Systems Pertinent items are noted in HPI.   Objective:  BP 123/78   Pulse 71   Wt 202 lb (91.6 kg)   BMI 31.64 kg/m  CONSTITUTIONAL: Well-developed, well-nourished female in no acute distress.  HENT:  Normocephalic, atraumatic, External right and left ear normal. Oropharynx is clear and moist EYES: Conjunctivae and EOM are normal. Pupils are equal, round, and reactive to light. No scleral icterus.  NECK: Normal range of motion, supple, no masses.  Normal thyroid.  SKIN: Skin is warm and dry. No rash noted. Not diaphoretic. No erythema. No pallor. NEUROLOGIC: Alert and oriented to person, place, and time. Normal reflexes, muscle tone coordination. No cranial nerve deficit noted. PSYCHIATRIC: Normal mood and affect. Normal behavior. Normal judgment and thought content. CARDIOVASCULAR: Normal heart rate noted, regular rhythm RESPIRATORY: Clear to auscultation bilaterally. Effort and breath sounds normal, no problems with respiration noted. BREASTS: Symmetric in size. No masses, skin changes, nipple drainage, or lymphadenopathy. Bilateral nipple piercings. 1 cm mobile, non-tender mass in right breast at 11 o'clock, 4 cm from areola ABDOMEN: Soft, normal bowel sounds, no distention noted.  No tenderness, rebound or guarding.  PELVIC: deferred MUSCULOSKELETAL: Normal range of motion. No tenderness.  No cyanosis, clubbing, or edema.  2+ distal pulses.  Exam done with chaperone present.  Assessment and Plan:   1. Well woman exam No issues  2. Encounter for counseling regarding contraception Happy with depo, reviewed plans for future pregnancies and lead time to get pregnant once off depo  3. Breast  lump Palpable mobile, non-tender 1 cm lump in right breast - rec [place], patient agreeable - [place] Unlisted Procedure Breast; Future   Declines STI screen Encouraged improvement in diet and exercise.  Mammogram n/a Referral for colonoscopy n/a  Routine preventative health maintenance measures emphasized. Please refer to After Visit Summary for other counseling recommendations.   Total face-to-face time with patient: 30 minutes. Over 50% of encounter was spent on counseling and coordination of care.   Baldemar Lenis, M.D. Attending [facility])

## 2020-03-06 ENCOUNTER — Ambulatory Visit
Admission: RE | Admit: 2020-03-06 | Discharge: 2020-03-06 | Disposition: A | Discharge status: Home / Self Care | Payer: Managed Care, Other (non HMO) | Source: Ambulatory Visit | Attending: Obstetrics and Gynecology | Admitting: Obstetrics and Gynecology

## 2020-03-06 ENCOUNTER — Other Ambulatory Visit: Payer: Self-pay

## 2020-03-06 DIAGNOSIS — N6310 Unspecified lump in the right breast, unspecified quadrant: Secondary | ICD-10-CM

## 2020-04-09 ENCOUNTER — Ambulatory Visit (INDEPENDENT_AMBULATORY_CARE_PROVIDER_SITE_OTHER): Payer: Managed Care, Other (non HMO)

## 2020-04-09 ENCOUNTER — Other Ambulatory Visit: Payer: Self-pay

## 2020-04-09 DIAGNOSIS — Z3042 Encounter for surveillance of injectable contraceptive: Secondary | ICD-10-CM

## 2020-04-09 MED ORDER — MEDROXYPROGESTERONE ACETATE 150 MG/ML IM SUSP: 150.0000 mg | INTRAMUSCULAR | 3 refills | Status: DC

## 2020-04-09 MED ORDER — MEDROXYPROGESTERONE ACETATE 150 MG/ML IM SUSP
150.0000 mg | Freq: Once | INTRAMUSCULAR | Status: AC
  Administered 2020-04-09: 150 mg via INTRAMUSCULAR

## 2020-04-09 NOTE — Progress Notes (Signed)
GYN presents for DEPO, Office stock DEPO injection given in LD, tolerated well.  Next DEPO Oct. 18-Nov. 1, 2021.  Administrations This Visit    medroxyPROGESTERone (DEPO-PROVERA) injection 150 mg    Admin Date 04/09/2020 Action Given Dose 150 mg Route Intramuscular Administered By Maretta Bees, RMA

## 2020-04-09 NOTE — Progress Notes (Signed)
Patient was assessed and managed by nursing staff during this encounter. I have reviewed the chart and agree with the documentation and plan. I have also made any necessary editorial changes.  Warden Fillers, MD 04/09/2020 9:16 AM

## 2020-05-16 ENCOUNTER — Ambulatory Visit: Payer: Managed Care, Other (non HMO)

## 2020-05-23 ENCOUNTER — Ambulatory Visit: Payer: Managed Care, Other (non HMO)

## 2020-06-25 ENCOUNTER — Ambulatory Visit: Payer: Managed Care, Other (non HMO)

## 2020-06-26 ENCOUNTER — Ambulatory Visit: Payer: Managed Care, Other (non HMO)

## 2020-06-28 ENCOUNTER — Other Ambulatory Visit: Payer: Self-pay

## 2020-06-28 ENCOUNTER — Ambulatory Visit (INDEPENDENT_AMBULATORY_CARE_PROVIDER_SITE_OTHER): Payer: Managed Care, Other (non HMO)

## 2020-06-28 VITALS — BP 132/88 | Wt 197.8 lb

## 2020-06-28 DIAGNOSIS — Z3042 Encounter for surveillance of injectable contraceptive: Secondary | ICD-10-CM | POA: Diagnosis not present

## 2020-06-28 NOTE — Progress Notes (Addendum)
Nurse visit presents for Ellen Mooney supply Depo Ellen Mooney is on time for inj Depo given LD without difficulty  Next Depo due Jan 6-20   Administrations This Visit    medroxyPROGESTERone (DEPO-PROVERA) injection 150 mg    Admin Date 06/28/2020 Action Given Dose 150 mg Route Intramuscular Administered By Lewayne Bunting, CMA

## 2020-06-28 NOTE — Progress Notes (Signed)
Patient was assessed and managed by nursing staff during this encounter. I have reviewed the chart and agree with the documentation and plan. I have also made any necessary editorial changes.  Catalina Antigua, MD 06/28/2020 1:23 PM

## 2020-09-19 ENCOUNTER — Ambulatory Visit (INDEPENDENT_AMBULATORY_CARE_PROVIDER_SITE_OTHER): Payer: Managed Care, Other (non HMO)

## 2020-09-19 ENCOUNTER — Other Ambulatory Visit: Payer: Self-pay

## 2020-09-19 DIAGNOSIS — Z3042 Encounter for surveillance of injectable contraceptive: Secondary | ICD-10-CM | POA: Diagnosis not present

## 2020-09-19 NOTE — Progress Notes (Signed)
Patient was assessed and managed by nursing staff during this encounter. I have reviewed the chart and agree with the documentation and plan. I have also made any necessary editorial changes.  Warden Fillers, MD 09/19/2020 8:50 AM

## 2020-09-19 NOTE — Progress Notes (Signed)
Pt is in the office for depo injection, administered in RD and pt tolerated well. Next due March 30- April 13. .. Administrations This Visit    medroxyPROGESTERone (DEPO-PROVERA) injection 150 mg    Admin Date 09/19/2020 Action Given Dose 150 mg Route Intramuscular Administered By Katrina Stack, RN

## 2020-11-14 NOTE — Progress Notes (Signed)
Subjective:    Patient ID: Ellen Mooney, female    DOB: Feb 21, 1991, 29 y.o.   MRN: [id number]  HPI Chief Complaint  Patient presents with  . Annual Exam    Fasting has ob-gyn   She is here for a complete physical exam. Last CPE: 2021  Other providers: OB/GYN- Dr. Mills Koller allergy and requests EPI pen.   Acid reflux - Pepcid or Tums at times.  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 Has a plan to go to graduate school at some point. Degrees in psychology and criminal justice. Denies smoking and drug use.  Alcohol once weekly now  Diet: fairly well balanced  Excerise: active job only   Immunizations: declines flu and Covid vaccines   Health maintenance:  Mammogram: N/A Colonoscopy: N/A Last Gynecological Exam: 06/21 Last Menstrual cycle: Depo  Pregnancies: 0 Last Dental Exam: last year  Last Eye Exam: scheduled for April   Wears seatbelt always, smoke detectors in home and functioning, does not text while driving and feels safe in home environment.   Reviewed allergies, medications, past medical, surgical, family, and social history.   Review of Systems Review of Systems Constitutional: -fever, -chills, -sweats, -unexpected weight change,-fatigue ENT: -runny nose, -ear pain, -sore throat Cardiology:  -chest pain, -palpitations, -edema Respiratory: -cough, -shortness of breath, -wheezing Gastroenterology: -abdominal pain, -nausea, -vomiting, -diarrhea, -constipation  Hematology: -bleeding or bruising problems Musculoskeletal: -arthralgias, -myalgias, -joint swelling, -back pain Ophthalmology: -vision changes Urology: -dysuria, -difficulty urinating, -hematuria, -urinary frequency, -urgency Neurology: -headache, -weakness, -tingling, -numbness       Objective:   Physical Exam BP 136/82   Pulse 64   Temp 97.9 F (36.6 C)   Resp 18   Ht 5' 5.5"  (1.664 m)   Wt 198 lb 9.6 oz (90.1 kg)   BMI 32.55 kg/m   General Appearance:    Alert, cooperative, no distress, appears stated age  Head:    Normocephalic, without obvious abnormality, atraumatic  Eyes:    PERRL, conjunctiva/corneas clear, EOM's intact  Ears:    Normal TM's and external ear canals  Nose:   Mask on   Throat:  mask on   Neck:   Supple, no lymphadenopathy;  thyroid:  no   enlargement/tenderness/nodules; no JVD  Back:    Spine nontender, no curvature, ROM normal, no CVA     tenderness  Lungs:     Clear to auscultation bilaterally without wheezes, rales or     ronchi; respirations unlabored  Chest Wall:    No tenderness or deformity   Heart:    Regular rate and rhythm, S1 and S2 normal, no murmur, rub   or gallop  Breast Exam:    No tenderness, masses, or nipple discharge or inversion.      No axillary lymphadenopathy  Abdomen:     Soft, non-tender, nondistended, normoactive bowel sounds,    no masses, no hepatosplenomegaly  Genitalia:    OB/GYN   Rectal:    Not performed due to age<40 and no related complaints  Extremities:   No clubbing, cyanosis or edema  Pulses:   2+ and symmetric all extremities  Skin:   Skin color, texture, turgor normal, no rashes or lesions  Lymph nodes:   Cervical, supraclavicular, and axillary nodes normal  Neurologic:   CNII-XII intact, normal strength, sensation and gait; reflexes 2+ and symmetric throughout  Psych:   Normal mood, affect, hygiene and grooming.         Assessment & Plan:  Routine general medical examination at a health care facility - Plan: POCT Urinalysis DIP (Proadvantage Device), CBC with Differential/Platelet, Comprehensive metabolic panel, T4, free, TSH, Lipid panel -Preventive health care reviewed.  She does see an OB/GYN annually.  Counseling on healthy lifestyle including diet and exercise.  Recommend regular dental and eye exams.  Immunizations reviewed.  She declines Covid and flu vaccines.  Discussed  safety and health promotion.  Gastroesophageal reflux disease, unspecified whether esophagitis present -Counseling on lifestyle modifications to help control GERD.  Obesity (BMI 30-39.9) - Plan: T4, free, TSH, Lipid panel -Encouraged a 10 pound weight loss by eating a healthier diet and increasing physical activity.  Family history of thyroid disease in father - Plan: T4, free, TSH  -Follow-up pending lab results  Nut allergy - Plan: EPINEPHrine 0.3 mg/0.3 mL IJ SOAJ injection

## 2020-11-14 NOTE — Patient Instructions (Addendum)
Schedule a dental and OB/GYN visit.     Preventive Care 35-31 Years Old, Female Preventive care refers to lifestyle choices and visits with your health care provider that can promote health and wellness. This includes:  A yearly physical exam. This is also called an annual wellness visit.  Regular dental and eye exams.  Immunizations.  Screening for certain conditions.  Healthy lifestyle choices, such as: ? Eating a healthy diet. ? Getting regular exercise. ? Not using drugs or products that contain nicotine and tobacco. ? Limiting alcohol use. What can I expect for my preventive care visit? Physical exam Your health care provider may check your:  Height and weight. These may be used to calculate your BMI (body mass index). BMI is a measurement that tells if you are at a healthy weight.  Heart rate and blood pressure.  Body temperature.  Skin for abnormal spots. Counseling Your health care provider may ask you questions about your:  Past medical problems.  Family's medical history.  Alcohol, tobacco, and drug use.  Emotional well-being.  Home life and relationship well-being.  Sexual activity.  Diet, exercise, and sleep habits.  Work and work Statistician.  Access to firearms.  Method of birth control.  Menstrual cycle.  Pregnancy history. What immunizations do I need? Vaccines are usually given at various ages, according to a schedule. Your health care provider will recommend vaccines for you based on your age, medical history, and lifestyle or other factors, such as travel or where you work.   What tests do I need? Blood tests  Lipid and cholesterol levels. These may be checked every 5 years starting at age 43.  Hepatitis C test.  Hepatitis B test. Screening  Diabetes screening. This is done by checking your blood sugar (glucose) after you have not eaten for a while (fasting).  STD (sexually transmitted disease) testing, if you are at  risk.  BRCA-related cancer screening. This may be done if you have a family history of breast, ovarian, tubal, or peritoneal cancers.  Pelvic exam and Pap test. This may be done every 3 years starting at age 22. Starting at age 2, this may be done every 5 years if you have a Pap test in combination with an HPV test. Talk with your health care provider about your test results, treatment options, and if necessary, the need for more tests.   Follow these instructions at home: Eating and drinking  Eat a healthy diet that includes fresh fruits and vegetables, whole grains, lean protein, and low-fat dairy products.  Take vitamin and mineral supplements as recommended by your health care provider.  Do not drink alcohol if: ? Your health care provider tells you not to drink. ? You are pregnant, may be pregnant, or are planning to become pregnant.  If you drink alcohol: ? Limit how much you have to 0-1 drink a day. ? Be aware of how much alcohol is in your drink. In the [place] one drink equals one 12 oz bottle of beer (355 mL), one 5 oz glass of wine (148 mL), or one 1 oz glass of hard liquor (44 mL).   Lifestyle  Take daily care of your teeth and gums. Brush your teeth every morning and night with fluoride toothpaste. Floss one time each day.  Stay active. Exercise for at least 30 minutes 5 or more days each week.  Do not use any products that contain nicotine or tobacco, such as cigarettes, e-cigarettes, and chewing tobacco. If you need  help quitting, ask your health care provider.  Do not use drugs.  If you are sexually active, practice safe sex. Use a condom or other form of protection to prevent STIs (sexually transmitted infections).  If you do not wish to become pregnant, use a form of birth control. If you plan to become pregnant, see your health care provider for a prepregnancy visit.  Find healthy ways to cope with stress, such as: ? Meditation, yoga, or listening to  music. ? Journaling. ? Talking to a trusted person. ? Spending time with friends and family. Safety  Always wear your seat belt while driving or riding in a vehicle.  Do not drive: ? If you have been drinking alcohol. Do not ride with someone who has been drinking. ? When you are tired or distracted. ? While texting.  Wear a helmet and other protective equipment during sports activities.  If you have firearms in your house, make sure you follow all gun safety procedures.  Seek help if you have been physically or sexually abused. What's next?  Go to your health care provider once a year for an annual wellness visit.  Ask your health care provider how often you should have your eyes and teeth checked.  Stay up to date on all vaccines. This information is not intended to replace advice given to you by your health care provider. Make sure you discuss any questions you have with your health care provider. Document Revised: 04/22/2020 Document Reviewed: 05/06/2018 Elsevier Patient Education  2021 [employer].

## 2020-11-15 ENCOUNTER — Other Ambulatory Visit: Payer: Self-pay

## 2020-11-15 ENCOUNTER — Encounter: Payer: Self-pay | Admitting: Family Medicine

## 2020-11-15 ENCOUNTER — Ambulatory Visit: Payer: Managed Care, Other (non HMO) | Admitting: Family Medicine

## 2020-11-15 VITALS — BP 136/82 | HR 64 | Temp 97.9°F | Resp 18 | Ht 65.5 in | Wt 198.6 lb

## 2020-11-15 DIAGNOSIS — Z8349 Family history of other endocrine, nutritional and metabolic diseases: Secondary | ICD-10-CM

## 2020-11-15 DIAGNOSIS — Z0000 Encounter for general adult medical examination without abnormal findings: Secondary | ICD-10-CM | POA: Diagnosis not present

## 2020-11-15 DIAGNOSIS — E669 Obesity, unspecified: Secondary | ICD-10-CM

## 2020-11-15 DIAGNOSIS — K219 Gastro-esophageal reflux disease without esophagitis: Secondary | ICD-10-CM | POA: Diagnosis not present

## 2020-11-15 DIAGNOSIS — Z91018 Allergy to other foods: Secondary | ICD-10-CM

## 2020-11-15 LAB — POCT URINALYSIS DIP (PROADVANTAGE DEVICE)
Bilirubin, UA: NEGATIVE
Glucose, UA: NEGATIVE mg/dL
Ketones, POC UA: NEGATIVE mg/dL
Leukocytes, UA: NEGATIVE
Nitrite, UA: NEGATIVE
Protein Ur, POC: NEGATIVE mg/dL
Specific Gravity, Urine: 1.01
Urobilinogen, Ur: 0.2
pH, UA: 6 (ref 5.0–8.0)

## 2020-11-15 MED ORDER — EPINEPHRINE 0.3 MG/0.3ML IJ SOAJ: 0.3000 mg | INTRAMUSCULAR | 0 refills | Status: AC | PRN

## 2020-11-16 LAB — CBC WITH DIFFERENTIAL/PLATELET
Basophils Absolute: 0 10*3/uL (ref 0.0–0.2)
Basos: 0 %
EOS (ABSOLUTE): 0.4 10*3/uL (ref 0.0–0.4)
Eos: 4 %
Hematocrit: 37.6 % (ref 34.0–46.6)
Hemoglobin: 12.7 g/dL (ref 11.1–15.9)
Immature Grans (Abs): 0 10*3/uL (ref 0.0–0.1)
Immature Granulocytes: 0 %
Lymphocytes Absolute: 2.5 10*3/uL (ref 0.7–3.1)
Lymphs: 28 %
MCH: 28.7 pg (ref 26.6–33.0)
MCHC: 33.8 g/dL (ref 31.5–35.7)
MCV: 85 fL (ref 79–97)
Monocytes Absolute: 0.7 10*3/uL (ref 0.1–0.9)
Monocytes: 7 %
Neutrophils Absolute: 5.3 10*3/uL (ref 1.4–7.0)
Neutrophils: 61 %
Platelets: 240 10*3/uL (ref 150–450)
RBC: 4.43 x10E6/uL (ref 3.77–5.28)
RDW: 12 % (ref 11.7–15.4)
WBC: 8.9 10*3/uL (ref 3.4–10.8)

## 2020-11-16 LAB — COMPREHENSIVE METABOLIC PANEL
ALT: 11 IU/L (ref 0–32)
AST: 15 IU/L (ref 0–40)
Albumin/Globulin Ratio: 2.3 — ABNORMAL HIGH (ref 1.2–2.2)
Albumin: 4.8 g/dL (ref 3.9–5.0)
Alkaline Phosphatase: 42 IU/L — ABNORMAL LOW (ref 44–121)
BUN/Creatinine Ratio: 6 — ABNORMAL LOW (ref 9–23)
BUN: 5 mg/dL — ABNORMAL LOW (ref 6–20)
Bilirubin Total: 0.5 mg/dL (ref 0.0–1.2)
CO2: 21 mmol/L (ref 20–29)
Calcium: 9.6 mg/dL (ref 8.7–10.2)
Chloride: 103 mmol/L (ref 96–106)
Creatinine, Ser: 0.84 mg/dL (ref 0.57–1.00)
Globulin, Total: 2.1 g/dL (ref 1.5–4.5)
Glucose: 77 mg/dL (ref 65–99)
Potassium: 3.8 mmol/L (ref 3.5–5.2)
Sodium: 139 mmol/L (ref 134–144)
Total Protein: 6.9 g/dL (ref 6.0–8.5)
eGFR: 96 mL/min/{1.73_m2} (ref >59)

## 2020-11-16 LAB — T4, FREE: Free T4: 1.71 ng/dL (ref 0.82–1.77)

## 2020-11-16 LAB — LIPID PANEL
Chol/HDL Ratio: 2.4 ratio (ref 0.0–4.4)
Cholesterol, Total: 128 mg/dL (ref 100–199)
HDL: 54 mg/dL (ref >39)
LDL Chol Calc (NIH): 60 mg/dL (ref 0–99)
Triglycerides: 65 mg/dL (ref 0–149)
VLDL Cholesterol Cal: 14 mg/dL (ref 5–40)

## 2020-11-16 LAB — TSH: TSH: 2.23 u[IU]/mL (ref 0.450–4.500)

## 2020-12-14 ENCOUNTER — Ambulatory Visit (INDEPENDENT_AMBULATORY_CARE_PROVIDER_SITE_OTHER): Payer: Managed Care, Other (non HMO)

## 2020-12-14 ENCOUNTER — Other Ambulatory Visit: Payer: Self-pay

## 2020-12-14 DIAGNOSIS — Z3042 Encounter for surveillance of injectable contraceptive: Secondary | ICD-10-CM

## 2020-12-14 NOTE — Progress Notes (Signed)
Agree with A & P. 

## 2020-12-14 NOTE — Progress Notes (Signed)
Subjective:  Pt in for Depo Provera injection.    Objective: Need for contraception. No unusual complaints.    Assessment: Pt tolerated Depo injection. Depo given LD  Plan:  Next injection due 6/27-03/15/21

## 2021-03-06 ENCOUNTER — Other Ambulatory Visit: Payer: Self-pay

## 2021-03-06 ENCOUNTER — Ambulatory Visit: Payer: Managed Care, Other (non HMO)

## 2021-03-06 VITALS — BP 119/78 | HR 69

## 2021-03-06 DIAGNOSIS — Z3042 Encounter for surveillance of injectable contraceptive: Secondary | ICD-10-CM

## 2021-03-06 MED ORDER — MEDROXYPROGESTERONE ACETATE 150 MG/ML IM SUSP: 150.0000 mg | INTRAMUSCULAR | 0 refills | Status: DC

## 2021-03-06 MED ORDER — MEDROXYPROGESTERONE ACETATE 150 MG/ML IM SUSP
150.0000 mg | Freq: Once | INTRAMUSCULAR | Status: AC
  Administered 2021-05-27: 150 mg via INTRAMUSCULAR

## 2021-03-06 NOTE — Progress Notes (Signed)
Patient presented to the office today for her depo-provera injection. Given by.D.Manoah Deckard L Deltoid muscle. HCG Serum: N/A Side Effects: None  Follow Up: 09/15-09/29 NDC: 9233-0076-22

## 2021-05-22 ENCOUNTER — Encounter: Payer: Self-pay | Admitting: *Deleted

## 2021-05-22 ENCOUNTER — Other Ambulatory Visit: Payer: Self-pay | Admitting: *Deleted

## 2021-05-22 ENCOUNTER — Telehealth: Payer: Self-pay | Admitting: *Deleted

## 2021-05-22 DIAGNOSIS — Z30013 Encounter for initial prescription of injectable contraceptive: Secondary | ICD-10-CM

## 2021-05-22 MED ORDER — MEDROXYPROGESTERONE ACETATE 150 MG/ML IM SUSP: 150.0000 mg | INTRAMUSCULAR | 4 refills | Status: DC

## 2021-05-22 NOTE — Telephone Encounter (Signed)
TC from patient requesting refill for Depo. Appt Monday. Recent annual exam and PAP up to date. Refills sent per protocol.

## 2021-05-27 ENCOUNTER — Ambulatory Visit (INDEPENDENT_AMBULATORY_CARE_PROVIDER_SITE_OTHER): Payer: Managed Care, Other (non HMO)

## 2021-05-27 ENCOUNTER — Other Ambulatory Visit: Payer: Self-pay

## 2021-05-27 DIAGNOSIS — Z3042 Encounter for surveillance of injectable contraceptive: Secondary | ICD-10-CM | POA: Diagnosis not present

## 2021-05-27 NOTE — Progress Notes (Addendum)
Subjective:  Pt in for Depo Provera injection.    Objective: Need for contraception. No unusual complaints.    Assessment: Depo given L Deltoid. Pt tolerated Depo injection.   Plan:  Next injection due Dec 5-19th.  Administrations This Visit     medroxyPROGESTERone (DEPO-PROVERA) injection 150 mg     Admin Date 05/27/2021 Action Given Dose 150 mg Route Intramuscular Administered By Lewayne Bunting, CMA

## 2021-05-27 NOTE — Progress Notes (Signed)
Patient was assessed and managed by nursing staff during this encounter. I have reviewed the chart and agree with the documentation and plan. I have also made any necessary editorial changes.  Catalina Antigua, MD 05/27/2021 11:14 AM

## 2021-08-12 ENCOUNTER — Other Ambulatory Visit: Payer: Self-pay

## 2021-08-12 ENCOUNTER — Ambulatory Visit (INDEPENDENT_AMBULATORY_CARE_PROVIDER_SITE_OTHER): Payer: Managed Care, Other (non HMO) | Admitting: *Deleted

## 2021-08-12 VITALS — BP 126/79 | HR 95

## 2021-08-12 DIAGNOSIS — Z3042 Encounter for surveillance of injectable contraceptive: Secondary | ICD-10-CM

## 2021-08-12 MED ORDER — MEDROXYPROGESTERONE ACETATE 150 MG/ML IM SUSP
150.0000 mg | Freq: Once | INTRAMUSCULAR | Status: AC
  Administered 2021-08-12: 150 mg via INTRAMUSCULAR

## 2021-08-12 NOTE — Progress Notes (Signed)
Date last pap: 11/04/18. Annual 02/23/20, advised to schedule Last Depo-Provera: 05/27/21. Side Effects if any: NA. Serum HCG indicated? NA. Depo-Provera 150 mg IM given by: Selena Batten. Tyjon Bowen, RNC in Left Deltoid. Next appointment due 10/28/21-11/11/21.

## 2021-09-23 ENCOUNTER — Encounter: Payer: Self-pay | Admitting: Advanced Practice Midwife

## 2021-09-23 ENCOUNTER — Ambulatory Visit (INDEPENDENT_AMBULATORY_CARE_PROVIDER_SITE_OTHER): Payer: Managed Care, Other (non HMO) | Admitting: Advanced Practice Midwife

## 2021-09-23 ENCOUNTER — Other Ambulatory Visit (HOSPITAL_COMMUNITY)
Admission: RE | Admit: 2021-09-23 | Discharge: 2021-09-23 | Disposition: A | Discharge status: Home / Self Care | Payer: Managed Care, Other (non HMO) | Source: Ambulatory Visit | Attending: Advanced Practice Midwife | Admitting: Advanced Practice Midwife

## 2021-09-23 ENCOUNTER — Other Ambulatory Visit: Payer: Self-pay

## 2021-09-23 VITALS — BP 125/74 | HR 77 | Ht 66.5 in | Wt 206.6 lb

## 2021-09-23 DIAGNOSIS — Z124 Encounter for screening for malignant neoplasm of cervix: Secondary | ICD-10-CM

## 2021-09-23 DIAGNOSIS — Z01419 Encounter for gynecological examination (general) (routine) without abnormal findings: Secondary | ICD-10-CM

## 2021-09-23 DIAGNOSIS — Z3009 Encounter for other general counseling and advice on contraception: Secondary | ICD-10-CM | POA: Diagnosis not present

## 2021-09-23 DIAGNOSIS — Z113 Encounter for screening for infections with a predominantly sexual mode of transmission: Secondary | ICD-10-CM | POA: Diagnosis present

## 2021-09-23 NOTE — Patient Instructions (Signed)
For contraceptive information, go to www.[email or web address].org.

## 2021-09-23 NOTE — Progress Notes (Addendum)
° °  Subjective:     Ellen Mooney is a 31 y.o. female here at [facility] for a routine exam.  Current complaints: none.  She has occasional spotting only on Depo. Some thinning of her hair with Depo.  Personal health questionnaire reviewed: yes.  Pt reports some family hx breast cancer.  Do you have a primary care provider? Yes, her PCP left the practice but she plans to follow up with another provider in their office Do you feel safe at home? yes  [place] Office Visit from 09/23/2021 in [place]  PHQ-2 Total Score 0       Health Maintenance Due  Topic Date Due   COVID-19 Vaccine (1) Never done   INFLUENZA VACCINE  Never done   PAP SMEAR-Modifier  11/04/2021     Risk factors for chronic health problems: Smoking: Alchohol/how much: Pt BMI: Body mass index is 32.85 kg/m.   Gynecologic History No LMP recorded. Patient has had an injection. Contraception: Depo-Provera injections Last Pap: 2020. Results were: normal Last mammogram: n/a.   Obstetric History OB History  Gravida Para Term Preterm AB Living  1       1    SAB IAB Ectopic Multiple Live Births    1          # Outcome Date GA Lbr Len/2nd Weight Sex Delivery Anes PTL Lv  1 IAB 03/05/18     TAB        The following portions of the patient's history were reviewed and updated as appropriate: allergies, current medications, past family history, past medical history, past social history, past surgical history, and problem list.  Review of Systems Pertinent items noted in HPI and remainder of comprehensive ROS otherwise negative.    Objective:   BP 125/74    Pulse 77    Ht 5' 6.5" (1.689 m)    Wt 206 lb 9.6 oz (93.7 kg)    BMI 32.85 kg/m  VS reviewed, nursing note reviewed,  Constitutional: well developed, well nourished, no distress HEENT: normocephalic CV: normal rate Pulm/chest wall: normal effort Breast Exam:  exam performed: right breast normal without mass, skin or  nipple changes or axillary nodes, left breast normal without mass, skin or nipple changes or axillary nodes Abdomen: soft Neuro: alert and oriented x 3 Skin: warm, dry Psych: affect normal Pelvic exam: Performed: Cervix pink, visually closed, without lesion, scant white creamy discharge, vaginal walls and external genitalia normal Bimanual exam: Cervix 0/long/high, firm, anterior, neg CMT, uterus nontender, nonenlarged, adnexa without tenderness, enlargement, or mass       Assessment/Plan:   1. Encounter for screening for cervical cancer  - Cytology - PAP( [place])  2. Routine screening for STI (sexually transmitted infection)  - Hepatitis B surface antigen - Hepatitis C antibody - HIV Antibody (routine testing w rflx) - RPR - Cervicovaginal ancillary only  3. Encounter for counseling regarding contraception --Discussed pt contraceptive plans and reviewed contraceptive methods based on pt preferences and effectiveness.  Pt prefers to continue Depo but is considering IUD. Given printed materials and website [email or web address] for more information.      Follow up in: 1  year  or as needed.   Fatima Blank, CNM 8:59 AM

## 2021-09-23 NOTE — Progress Notes (Signed)
Pt presents for annual, pap, and all STD testing.  Next Depo scheduled 11/04/21 Pt reports no concerns today.

## 2021-09-24 LAB — RPR: RPR Ser Ql: NONREACTIVE

## 2021-09-24 LAB — CERVICOVAGINAL ANCILLARY ONLY
Chlamydia: NEGATIVE
Comment: NEGATIVE
Comment: NEGATIVE
Comment: NORMAL
Neisseria Gonorrhea: NEGATIVE
Trichomonas: NEGATIVE

## 2021-09-24 LAB — HEPATITIS B SURFACE ANTIGEN: Hepatitis B Surface Ag: NEGATIVE

## 2021-09-24 LAB — HEPATITIS C ANTIBODY: Hep C Virus Ab: <0.1 s/co ratio (ref 0.0–0.9)

## 2021-09-24 LAB — HIV ANTIBODY (ROUTINE TESTING W REFLEX): HIV Screen 4th Generation wRfx: NONREACTIVE

## 2021-09-26 ENCOUNTER — Encounter: Payer: Self-pay | Admitting: Advanced Practice Midwife

## 2021-09-26 LAB — CYTOLOGY - PAP
Comment: NEGATIVE
Comment: NEGATIVE
Diagnosis: NEGATIVE
HPV 16: NEGATIVE
HPV 18 / 45: NEGATIVE
High risk HPV: POSITIVE — AB

## 2021-10-22 ENCOUNTER — Other Ambulatory Visit: Payer: Self-pay

## 2021-10-22 ENCOUNTER — Ambulatory Visit (INDEPENDENT_AMBULATORY_CARE_PROVIDER_SITE_OTHER): Payer: Managed Care, Other (non HMO)

## 2021-10-22 ENCOUNTER — Ambulatory Visit
Admission: RE | Admit: 2021-10-22 | Discharge: 2021-10-22 | Disposition: A | Discharge status: Home / Self Care | Payer: Managed Care, Other (non HMO) | Source: Ambulatory Visit | Attending: Physician Assistant | Admitting: Physician Assistant

## 2021-10-22 VITALS — BP 126/89 | HR 86 | Temp 98.4°F | Ht 66.5 in | Wt 198.0 lb

## 2021-10-22 DIAGNOSIS — L03011 Cellulitis of right finger: Secondary | ICD-10-CM | POA: Diagnosis not present

## 2021-10-22 DIAGNOSIS — M79644 Pain in right finger(s): Secondary | ICD-10-CM

## 2021-10-22 MED ORDER — DOXYCYCLINE HYCLATE 100 MG PO CAPS: 100.0000 mg | ORAL_CAPSULE | Freq: Two times a day (BID) | ORAL | 0 refills | Status: DC

## 2021-10-22 MED ORDER — IBUPROFEN 800 MG PO TABS
800.0000 mg | ORAL_TABLET | Freq: Once | ORAL | Status: AC
  Administered 2021-10-22: 800 mg via ORAL

## 2021-10-22 NOTE — ED Provider Notes (Signed)
[facility]    CSN: [id number] Arrival date & time: 10/22/21  1345      History   Chief Complaint Chief Complaint  Patient presents with   Appointment    Nail Infection Right Middle Finger    HPI Ellen Mooney is a 31 y.o. female.   Patient here today for evaluation of distal right middle finger pain that started after she accidentally hit her finger at work on Sunday night.  She reports that mechanism of injury was small like a jam and she was wearing false acrylic nails at the time.  She states that swelling and pain has increased with time.  She has tried taking Advil and BC powders without significant relief.  The history is provided by the patient.   Past Medical History:  Diagnosis Date   Known health problems: none     Patient Active Problem List   Diagnosis Date Noted   Elevated serum creatinine 09/27/2019   Family history of thyroid disease in father 09/27/2019   Elevated BP without diagnosis of hypertension 09/27/2019   Family history of hypertension 09/27/2019   Obesity (BMI 30-39.9) 09/27/2019   Gastroesophageal reflux disease 11/22/2018   Insomnia 08/09/2018   Initiation of Depo Provera 03/22/2018    Past Surgical History:  Procedure Laterality Date   NO PAST SURGERIES      OB History     Gravida  1   Para      Term      Preterm      AB  1   Living         SAB      IAB  1   Ectopic      Multiple      Live Births               Home Medications    Prior to Admission medications   Medication Sig Start Date End Date Taking? Authorizing Provider  doxycycline (VIBRAMYCIN) 100 MG capsule Take 1 capsule (100 mg total) by mouth 2 (two) times daily. 10/22/21  Yes Francene Finders, PA-C  medroxyPROGESTERone (DEPO-PROVERA) 150 MG/ML injection Inject 1 mL (150 mg total) into the muscle every 3 (three) months. 05/22/21  Yes Griffin Basil, MD  Misc Natural Products [facility] ZINC/VIT C/IMMUNE MT) Use as directed in  the mouth or throat.   Yes [provider]  Multiple Vitamin (MULTI-VITAMIN DAILY PO) Take by mouth.   Yes [provider]  Aspirin-Caffeine (BC FAST PAIN RELIEF PO) Take by mouth. Patient not taking: Reported on 09/23/2021    [provider]  EPINEPHrine 0.3 mg/0.3 mL IJ SOAJ injection Inject 0.3 mg into the muscle as needed for anaphylaxis. Patient not taking: Reported on 09/23/2021 11/15/20   Girtha Rm, PA-C  fexofenadine (ALLEGRA) 180 MG tablet Take by mouth. Patient not taking: Reported on 09/23/2021    [provider]  omeprazole (PRILOSEC) 10 MG capsule Take by mouth. Patient not taking: Reported on 09/23/2021    [provider]    Family History Family History  Problem Relation Age of Onset   Diabetes Paternal Grandfather    Hypertension Paternal Grandfather    Stroke Paternal Grandfather    Heart attack Paternal Grandfather    Cancer Paternal Grandmother        Breast   Hypertension Maternal Grandmother    Diabetes Maternal Grandfather    Hypertension Maternal Grandfather    Thyroid disease Father    Hypertension Father  Spondylolisthesis Mother    Clotting disorder Mother        PE, DVT on Eliquis    Asthma Brother     Social History Social History   Tobacco Use   Smoking status: Former    Types: E-cigarettes   Smokeless tobacco: Never   Tobacco comments:    Juuel Vape  Vaping Use   Vaping Use: Former  Substance Use Topics   Alcohol use: Yes    Comment: drinks 1 per week    Drug use: No     Allergies   Kiwi extract and Peanut-containing drug products   Review of Systems Review of Systems  Constitutional:  Negative for chills and fever.  Eyes:  Negative for discharge and redness.  Gastrointestinal:  Negative for abdominal pain, nausea and vomiting.  Musculoskeletal:  Positive for arthralgias. Negative for joint swelling.  Neurological:  Negative for numbness.    Physical Exam Triage Vital  Signs ED Triage Vitals  Enc Vitals Group     BP 10/22/21 1420 126/89     Pulse Rate 10/22/21 1420 86     Resp --      Temp 10/22/21 1420 98.4 F (36.9 C)     Temp Source 10/22/21 1420 Oral     SpO2 10/22/21 1420 100 %     Weight 10/22/21 1421 198 lb (89.8 kg)     Height 10/22/21 1421 5' 6.5" (1.689 m)     Head Circumference --      Peak Flow --      Pain Score 10/22/21 1421 10     Pain Loc --      Pain Edu? --      Excl. in [place]? --    No data found.  Updated Vital Signs BP 126/89 (BP Location: Left Arm)    Pulse 86    Temp 98.4 F (36.9 C) (Oral)    Ht 5' 6.5" (1.689 m)    Wt 198 lb (89.8 kg)    SpO2 100%    BMI 31.48 kg/m   Physical Exam Vitals and nursing note reviewed.  Constitutional:      General: She is not in acute distress.    Appearance: Normal appearance. She is not ill-appearing.  HENT:     Head: Normocephalic and atraumatic.  Eyes:     Conjunctiva/sclera: Conjunctivae normal.  Cardiovascular:     Rate and Rhythm: Normal rate.  Pulmonary:     Effort: Pulmonary effort is normal.  Musculoskeletal:     Comments: Decreased ROM of right 3rd DIP, swelling and erythema with TTP noted to distal right 3rd finger diffusely  Neurological:     Mental Status: She is alert.     Comments: Gross sensation intact to right 3rd finger  Psychiatric:        Mood and Affect: Mood normal.        Behavior: Behavior normal.        Thought Content: Thought content normal.     UC Treatments / Results  Labs (all labs ordered are listed, but only abnormal results are displayed) Labs Reviewed - No data to display  EKG   Radiology DG Finger Middle Right  Result Date: 10/22/2021 CLINICAL DATA:  A 31 year old female presents with injury to the middle finger, finger is swollen and sore currently. EXAM: RIGHT MIDDLE FINGER 2+V COMPARISON:  None FINDINGS: Soft tissue swelling suggested about the long finger. No sign of acute fracture or dislocation. IMPRESSION: Electronically  Signed   By:  Zetta Bills M.D.   On: 10/22/2021 14:42    Procedures Procedures (including critical care time)  Medications Ordered in UC Medications  ibuprofen (ADVIL) tablet 800 mg (800 mg Oral Given 10/22/21 1459)    Initial Impression / Assessment and Plan / UC Course  I have reviewed the triage vital signs and the nursing notes.  Pertinent labs & imaging results that were available during my care of the patient were reviewed by me and considered in my medical decision making (see chart for details).    No fracture on xray. Doxycycline prescribed to cover infection. Suspect nail Mooney likely fall off. Recommended follow up with any further concerns.   Final Clinical Impressions(s) / UC Diagnoses   Final diagnoses:  Cellulitis of finger of right hand   Discharge Instructions   None    ED Prescriptions     Medication Sig Dispense Auth. Provider   doxycycline (VIBRAMYCIN) 100 MG capsule Take 1 capsule (100 mg total) by mouth 2 (two) times daily. 20 capsule Francene Finders, PA-C      PDMP not reviewed this encounter.   Francene Finders, PA-C 10/22/21 [phone]

## 2021-10-22 NOTE — ED Triage Notes (Signed)
Patient states that she hit her right middle finger at work Sunday night, now finger is swollen and very sore.  Patient has taken Advil.

## 2021-11-04 ENCOUNTER — Ambulatory Visit: Payer: Managed Care, Other (non HMO)

## 2021-11-13 ENCOUNTER — Ambulatory Visit: Payer: Managed Care, Other (non HMO)

## 2021-11-18 ENCOUNTER — Encounter: Payer: Managed Care, Other (non HMO) | Admitting: Family Medicine

## 2021-11-18 ENCOUNTER — Ambulatory Visit (INDEPENDENT_AMBULATORY_CARE_PROVIDER_SITE_OTHER): Payer: Managed Care, Other (non HMO) | Admitting: Emergency Medicine

## 2021-11-18 ENCOUNTER — Other Ambulatory Visit: Payer: Self-pay

## 2021-11-18 VITALS — BP 145/83 | HR 76 | Ht 66.0 in | Wt 199.7 lb

## 2021-11-18 DIAGNOSIS — Z3042 Encounter for surveillance of injectable contraceptive: Secondary | ICD-10-CM | POA: Diagnosis not present

## 2021-11-18 LAB — POCT URINE PREGNANCY: Preg Test, Ur: NEGATIVE

## 2021-11-18 MED ORDER — MEDROXYPROGESTERONE ACETATE 150 MG/ML IM SUSP
150.0000 mg | Freq: Once | INTRAMUSCULAR | Status: AC
  Administered 2021-11-18: 150 mg via INTRAMUSCULAR

## 2021-11-18 NOTE — Progress Notes (Signed)
Date last pap: 09/23/2021 ?Last Depo-Provera: 08/12/2021. ?Side Effects if any: none. ?Serum HCG indicated? Yes- 7 days outside of window- NEG. ?Depo-Provera 150 mg IM given by: Resa Miner, RN. ?Next appointment due 5/29-Jun 12.  ? ?Patient is 7 days outside of window today. Reports unprotected sex in last 7 days. UPT-negative. Depo given today, per protocol.  ?

## 2022-02-03 ENCOUNTER — Ambulatory Visit: Payer: Managed Care, Other (non HMO)

## 2022-02-07 ENCOUNTER — Ambulatory Visit: Payer: Managed Care, Other (non HMO)

## 2022-02-17 ENCOUNTER — Ambulatory Visit (INDEPENDENT_AMBULATORY_CARE_PROVIDER_SITE_OTHER): Payer: BLUE CROSS/BLUE SHIELD

## 2022-02-17 DIAGNOSIS — Z3042 Encounter for surveillance of injectable contraceptive: Secondary | ICD-10-CM | POA: Diagnosis not present

## 2022-02-17 NOTE — Progress Notes (Signed)
Pt is in the office for depo injection, administered in L Del and pt tolerated well. Next due Aug 28- Sept 11th .Marland Kitchen Administrations This Visit     medroxyPROGESTERone (DEPO-PROVERA) injection 150 mg     Admin Date 02/17/2022 Action Given Dose 150 mg Route Intramuscular Administered By Katrina Stack, RN

## 2022-03-27 ENCOUNTER — Telehealth: Payer: BLUE CROSS/BLUE SHIELD | Admitting: Physician Assistant

## 2022-03-27 DIAGNOSIS — L240 Irritant contact dermatitis due to detergents: Secondary | ICD-10-CM | POA: Diagnosis not present

## 2022-03-28 MED ORDER — PREDNISONE 10 MG PO TABS: ORAL_TABLET | ORAL | 0 refills | Status: DC

## 2022-03-28 NOTE — Progress Notes (Signed)
E Visit for Rash  We are sorry that you are not feeling well. Here is how we plan to help!  Based on what you shared with me it looks like you have contact dermatitis.  Contact dermatitis is a skin rash caused by something that touches the skin and causes irritation or inflammation.  Your skin may be red, swollen, dry, cracked, and itch.  The rash should go away in a few days but can last a few weeks.  If you get a rash, it's important to figure out what caused it so the irritant can be avoided in the future. and I am prescribing a two week course of steroids (37 tablets of 10 mg prednisone).  Days 1-4 take 4 tablets (40 mg) daily  Days 5-8 take 3 tablets (30 mg) daily, Days 9-11 take 2 tablets (20 mg) daily, Days 12-14 take 1 tablet (10 mg) daily.    HOME CARE:  Take cool showers and avoid direct sunlight. Apply cool compress or wet dressings. Take a bath in an oatmeal bath.  Sprinkle content of one Aveeno packet under running faucet with comfortably warm water.  Bathe for 15-20 minutes, 1-2 times daily.  Pat dry with a towel. Do not rub the rash. Use hydrocortisone cream. Take an antihistamine like Benadryl for widespread rashes that itch.  The adult dose of Benadryl is 25-50 mg by mouth 4 times daily. Caution:  This type of medication may cause sleepiness.  Do not drink alcohol, drive, or operate dangerous machinery while taking antihistamines.  Do not take these medications if you have prostate enlargement.  Read package instructions thoroughly on all medications that you take.  GET HELP RIGHT AWAY IF:  Symptoms don't go away after treatment. Severe itching that persists. If you rash spreads or swells. If you rash begins to smell. If it blisters and opens or develops a yellow-brown crust. You develop a fever. You have a sore throat. You become short of breath.  MAKE SURE YOU:  Understand these instructions. Will watch your condition. Will get help right away if you are not doing  well or get worse.  Thank you for choosing an e-visit.  Your e-visit answers were reviewed by a board certified advanced clinical practitioner to complete your personal care plan. Depending upon the condition, your plan could have included both over the counter or prescription medications.  Please review your pharmacy choice. Make sure the pharmacy is open so you can pick up prescription now. If there is a problem, you may contact your provider through MyChart messaging and have the prescription routed to another pharmacy.  Your safety is important to us. If you have drug allergies check your prescription carefully.   For the next 24 hours you can use MyChart to ask questions about today's visit, request a non-urgent call back, or ask for a work or school excuse. You will get an email in the next two days asking about your experience. I hope that your e-visit has been valuable and will speed your recovery.  I provided 5 minutes of non face-to-face time during this encounter for chart review and documentation.   

## 2022-05-05 ENCOUNTER — Ambulatory Visit (INDEPENDENT_AMBULATORY_CARE_PROVIDER_SITE_OTHER): Payer: BLUE CROSS/BLUE SHIELD | Admitting: Emergency Medicine

## 2022-05-05 VITALS — BP 119/77 | HR 67 | Ht 66.0 in | Wt 223.5 lb

## 2022-05-05 DIAGNOSIS — Z3042 Encounter for surveillance of injectable contraceptive: Secondary | ICD-10-CM | POA: Diagnosis not present

## 2022-05-05 MED ORDER — MEDROXYPROGESTERONE ACETATE 150 MG/ML IM SUSP: 150.0000 mg | INTRAMUSCULAR | 1 refills | Status: DC

## 2022-05-05 MED ORDER — MEDROXYPROGESTERONE ACETATE 150 MG/ML IM SUSP
150.0000 mg | Freq: Once | INTRAMUSCULAR | Status: AC
  Administered 2022-05-05: 150 mg via INTRAMUSCULAR

## 2022-05-05 NOTE — Progress Notes (Signed)
Date last pap: 09/23/2021. Last Depo-Provera: 02/17/22. Side Effects if any: NA. Serum HCG indicated? NA. Depo-Provera 150 mg IM given by: Leavy Cella, RN into LEFT deltoid, tolerated well. Next appointment due Nov 13-27.   2 Depo RX refilled until patient's annual, due Jan 2024.

## 2022-05-14 ENCOUNTER — Encounter: Payer: Self-pay | Admitting: Internal Medicine

## 2022-05-15 ENCOUNTER — Ambulatory Visit: Payer: BLUE CROSS/BLUE SHIELD

## 2022-06-17 ENCOUNTER — Encounter: Payer: Self-pay | Admitting: Internal Medicine

## 2022-06-24 IMAGING — DX DG FINGER MIDDLE 2+V*R*
2 series · 2 of 2 positions shown · non-contrast
Comparison: None

CLINICAL DATA: A 30-year-old female presents with injury to the
middle finger, finger is swollen and sore currently.

EXAM:
RIGHT MIDDLE FINGER 2+V

[finger pa]
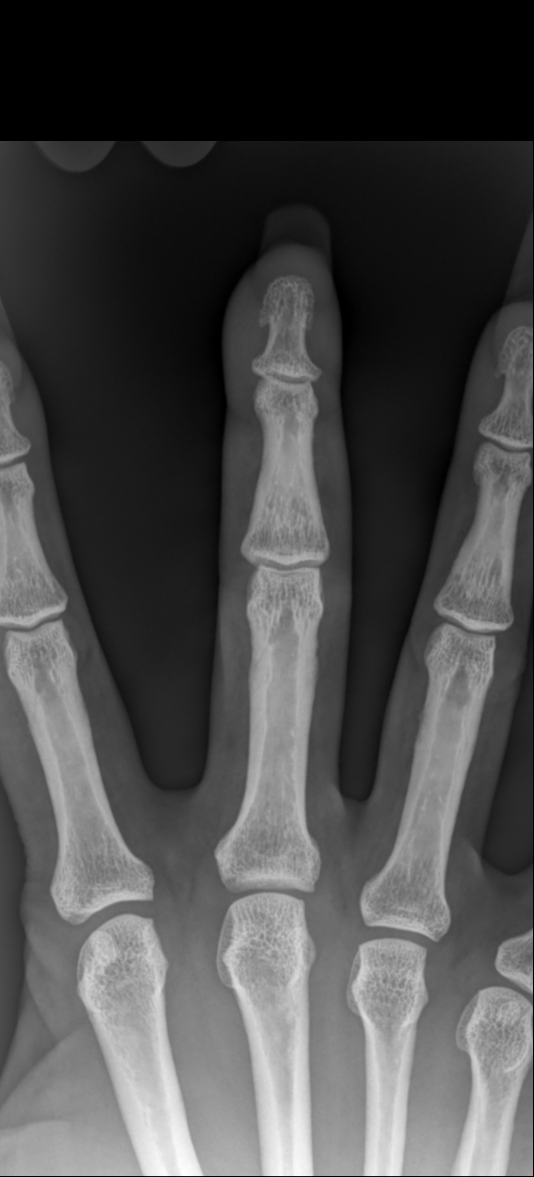

[finger lat]
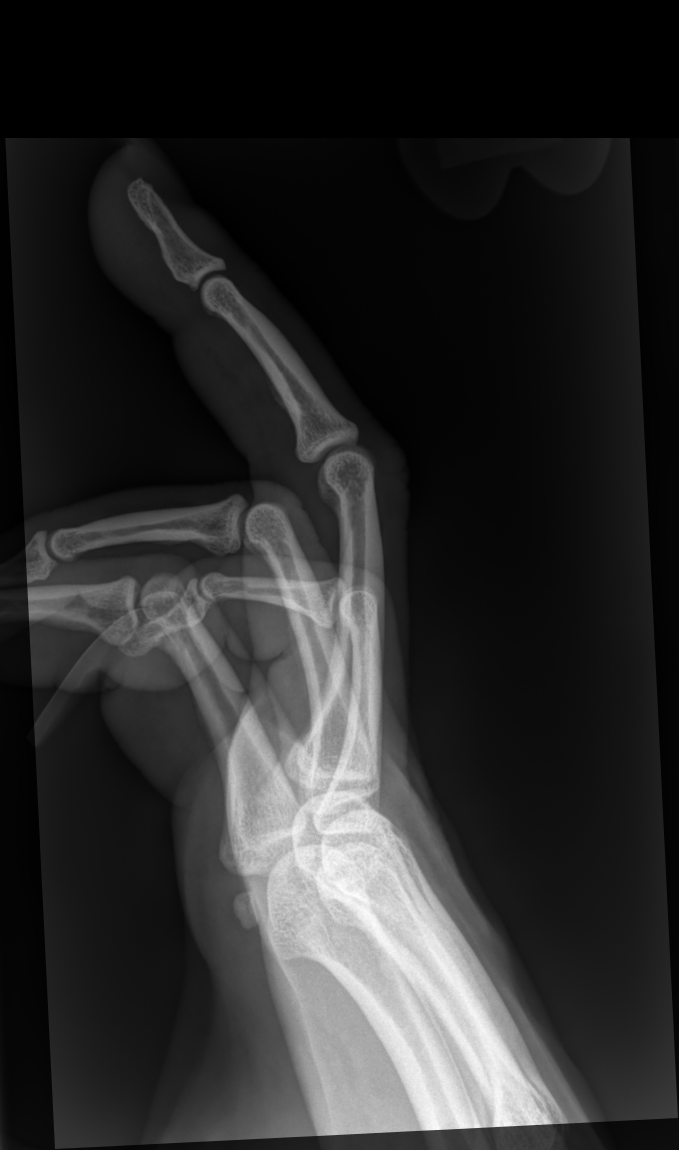

[2 of 2 positions shown; findings below may reference images not displayed]

FINDINGS: Soft tissue swelling suggested about the long finger. No sign of
acute fracture or dislocation.
IMPRESSION: Mild soft tissue swelling suggested without sign of fracture or
dislocation.

## 2022-07-22 ENCOUNTER — Encounter: Payer: Self-pay | Admitting: Internal Medicine

## 2022-07-22 ENCOUNTER — Other Ambulatory Visit: Payer: Self-pay | Admitting: Obstetrics and Gynecology

## 2022-07-22 DIAGNOSIS — Z30013 Encounter for initial prescription of injectable contraceptive: Secondary | ICD-10-CM

## 2022-07-30 ENCOUNTER — Ambulatory Visit (INDEPENDENT_AMBULATORY_CARE_PROVIDER_SITE_OTHER): Payer: Self-pay

## 2022-07-30 DIAGNOSIS — Z3042 Encounter for surveillance of injectable contraceptive: Secondary | ICD-10-CM

## 2022-07-30 MED ORDER — MEDROXYPROGESTERONE ACETATE 150 MG/ML IM SUSP
150.0000 mg | Freq: Once | INTRAMUSCULAR | Status: AC
  Administered 2022-07-30: 150 mg via INTRAMUSCULAR

## 2022-07-30 NOTE — Progress Notes (Signed)
Date last pap: 09/23/21. Last Depo-Provera: 05/05/22. Side Effects if any: N/A. Serum HCG indicated? N/A. Depo-Provera 150 mg IM given by: Angelica Pou, RN. Inj given in LD. Patient tolerated well. Next appointment due 2/7-2/21.

## 2022-09-29 DIAGNOSIS — J320 Chronic maxillary sinusitis: Secondary | ICD-10-CM | POA: Insufficient documentation

## 2022-10-27 ENCOUNTER — Other Ambulatory Visit (HOSPITAL_COMMUNITY)
Admission: RE | Admit: 2022-10-27 | Discharge: 2022-10-27 | Disposition: A | Discharge status: Home / Self Care | Payer: Managed Care, Other (non HMO) | Source: Ambulatory Visit | Attending: Advanced Practice Midwife | Admitting: Advanced Practice Midwife

## 2022-10-27 ENCOUNTER — Encounter: Payer: Self-pay | Admitting: Advanced Practice Midwife

## 2022-10-27 ENCOUNTER — Ambulatory Visit: Payer: Self-pay

## 2022-10-27 ENCOUNTER — Ambulatory Visit (INDEPENDENT_AMBULATORY_CARE_PROVIDER_SITE_OTHER): Payer: Managed Care, Other (non HMO) | Admitting: Advanced Practice Midwife

## 2022-10-27 VITALS — BP 134/84 | HR 80 | Ht 66.5 in | Wt 207.4 lb

## 2022-10-27 DIAGNOSIS — R87820 Cervical low risk human papillomavirus (HPV) DNA test positive: Secondary | ICD-10-CM | POA: Insufficient documentation

## 2022-10-27 DIAGNOSIS — Z3042 Encounter for surveillance of injectable contraceptive: Secondary | ICD-10-CM | POA: Diagnosis not present

## 2022-10-27 DIAGNOSIS — N939 Abnormal uterine and vaginal bleeding, unspecified: Secondary | ICD-10-CM | POA: Diagnosis not present

## 2022-10-27 DIAGNOSIS — Z01419 Encounter for gynecological examination (general) (routine) without abnormal findings: Secondary | ICD-10-CM

## 2022-10-27 DIAGNOSIS — Z124 Encounter for screening for malignant neoplasm of cervix: Secondary | ICD-10-CM

## 2022-10-27 DIAGNOSIS — Z113 Encounter for screening for infections with a predominantly sexual mode of transmission: Secondary | ICD-10-CM

## 2022-10-27 MED ORDER — MEDROXYPROGESTERONE ACETATE 150 MG/ML IM SUSP
150.0000 mg | Freq: Once | INTRAMUSCULAR | Status: AC
  Administered 2022-10-27: 150 mg via INTRAMUSCULAR

## 2022-10-27 NOTE — Progress Notes (Signed)
   Subjective:     Ellen Mooney is a 32 y.o. female here at [facility] for a routine exam.  Current complaints: some spotting this month, was taking abx for sinus infection.  Personal health questionnaire reviewed: yes.  Do you have a primary care provider? yes Do you feel safe at home? yes  [employer] Visit from 10/27/2022 in [facility] for [place] at [facility] Total Score 0       Health Maintenance Due  Topic Date Due   COVID-19 Vaccine (1) Never done   INFLUENZA VACCINE  Never done     Risk factors for chronic health problems: Smoking: Alchohol/how much: Pt BMI: Body mass index is 32.97 kg/m.   Gynecologic History No LMP recorded. Patient has had an injection. Contraception: Depo-Provera injections Last Pap: 09/23/2021. Results were: normal cytology, HPV positive, neg 16/18/45 Last mammogram: n/a.   Obstetric History OB History  Gravida Para Term Preterm AB Living  1       1    SAB IAB Ectopic Multiple Live Births    1          # Outcome Date GA Lbr Len/2nd Weight Sex Delivery Anes PTL Lv  1 IAB 03/05/18     TAB        The following portions of the patient's history were reviewed and updated as appropriate: allergies, current medications, past family history, past medical history, past social history, past surgical history, and problem list.  Review of Systems Pertinent items noted in HPI and remainder of comprehensive ROS otherwise negative.    Objective:   VS reviewed, nursing note reviewed,  Constitutional: well developed, well nourished, no distress HEENT: normocephalic, thyroid without enlargement or mass HEART: RRR, no murmurs rubs/gallops RESP: clear and equal to auscultation bilaterally in all lobes  Breast Exam: exam performed: right breast normal without mass, skin or nipple changes or axillary nodes, left breast normal without mass, skin or nipple changes or axillary nodes Abdomen: soft Neuro: alert and  oriented x 3 Skin: warm, dry Psych: affect normal Pelvic exam: Performed: Cervix pink, visually closed, without lesion, scant white creamy discharge, vaginal walls and external genitalia normal Bimanual exam: Cervix 0/long/high, firm, anterior, neg CMT, uterus nontender, nonenlarged, adnexa without tenderness, enlargement, or mass        Assessment/Plan:   1. Encounter for Depo-Provera contraception --Pt pleased with Depo for contraception and desires to continue.  - medroxyPROGESTERone (DEPO-PROVERA) injection 150 mg  2. Women's annual routine gynecological examination   3. Vaginal spotting --On abx this month for sinus infection, spotting related to abx/illness.  Now resolved.   4. Routine screening for STI (sexually transmitted infection)  - Cervicovaginal ancillary only - Hepatitis B surface antigen - Hepatitis C antibody - HIV Antibody (routine testing w rflx) - RPR  5. Screening for cervical cancer  - Cytology - PAP     No follow-ups on file.   Fatima Blank, CNM 9:13 AM

## 2022-10-27 NOTE — Progress Notes (Signed)
Pt presents for annual exam. Pt reports having random spotting for the past month. Denies any other abnormal vaginal symptoms. Denies any abnormal breast changes. Depo given today in left deltoid. Pt requesting refill for Depo. No other concerns at this time.  PHQ & GAD negative.

## 2022-10-28 LAB — CERVICOVAGINAL ANCILLARY ONLY
Chlamydia: NEGATIVE
Comment: NEGATIVE
Comment: NEGATIVE
Comment: NORMAL
Neisseria Gonorrhea: NEGATIVE
Trichomonas: NEGATIVE

## 2022-10-28 LAB — HEPATITIS C ANTIBODY: Hep C Virus Ab: NONREACTIVE

## 2022-10-28 LAB — RPR: RPR Ser Ql: NONREACTIVE

## 2022-10-28 LAB — HEPATITIS B SURFACE ANTIGEN: Hepatitis B Surface Ag: NEGATIVE

## 2022-10-28 LAB — HIV ANTIBODY (ROUTINE TESTING W REFLEX): HIV Screen 4th Generation wRfx: NONREACTIVE

## 2022-10-29 LAB — CYTOLOGY - PAP
Adequacy: ABSENT
Comment: NEGATIVE
Diagnosis: NEGATIVE
High risk HPV: NEGATIVE

## 2023-01-14 ENCOUNTER — Other Ambulatory Visit: Payer: Self-pay | Admitting: Obstetrics and Gynecology

## 2023-01-14 DIAGNOSIS — Z30013 Encounter for initial prescription of injectable contraceptive: Secondary | ICD-10-CM

## 2023-01-20 ENCOUNTER — Ambulatory Visit (INDEPENDENT_AMBULATORY_CARE_PROVIDER_SITE_OTHER): Payer: Managed Care, Other (non HMO)

## 2023-01-20 DIAGNOSIS — Z1339 Encounter for screening examination for other mental health and behavioral disorders: Secondary | ICD-10-CM | POA: Diagnosis not present

## 2023-01-20 DIAGNOSIS — Z3042 Encounter for surveillance of injectable contraceptive: Secondary | ICD-10-CM | POA: Diagnosis not present

## 2023-01-20 MED ORDER — MEDROXYPROGESTERONE ACETATE 150 MG/ML IM SUSY
150.0000 mg | PREFILLED_SYRINGE | Freq: Once | INTRAMUSCULAR | Status: AC
  Administered 2023-01-20: 150 mg via INTRAMUSCULAR

## 2023-01-20 NOTE — Progress Notes (Signed)
Date last pap: 10/27/22. Last Depo-Provera: 10/27/22. Side Effects if any: N/A. Serum HCG indicated? N/A. Depo-Provera 150 mg IM given by: Dreama Saa., RN. Injection given in LD. Patient tolerated well Next appointment due July 30- Aug 13.

## 2023-03-20 DIAGNOSIS — L209 Atopic dermatitis, unspecified: Secondary | ICD-10-CM | POA: Insufficient documentation

## 2023-03-25 ENCOUNTER — Other Ambulatory Visit: Payer: Self-pay | Admitting: Obstetrics and Gynecology

## 2023-03-25 ENCOUNTER — Other Ambulatory Visit (INDEPENDENT_AMBULATORY_CARE_PROVIDER_SITE_OTHER): Payer: Self-pay | Admitting: Physician Assistant

## 2023-03-25 DIAGNOSIS — Z30013 Encounter for initial prescription of injectable contraceptive: Secondary | ICD-10-CM

## 2023-03-25 MED ORDER — MEDROXYPROGESTERONE ACETATE 150 MG/ML IM SUSP: 150.0000 mg | INTRAMUSCULAR | 0 refills | Status: DC

## 2023-04-17 ENCOUNTER — Ambulatory Visit (INDEPENDENT_AMBULATORY_CARE_PROVIDER_SITE_OTHER): Payer: Managed Care, Other (non HMO) | Admitting: General Practice

## 2023-04-17 VITALS — BP 123/83 | HR 70 | Ht 67.0 in | Wt 196.3 lb

## 2023-04-17 DIAGNOSIS — Z3042 Encounter for surveillance of injectable contraceptive: Secondary | ICD-10-CM

## 2023-04-17 MED ORDER — MEDROXYPROGESTERONE ACETATE 150 MG/ML IM SUSP: 150.0000 mg | INTRAMUSCULAR | 1 refills | Status: DC

## 2023-04-17 MED ORDER — MEDROXYPROGESTERONE ACETATE 150 MG/ML IM SUSP
150.0000 mg | Freq: Once | INTRAMUSCULAR | Status: AC
  Administered 2023-04-17: 150 mg via INTRAMUSCULAR

## 2023-04-17 NOTE — Progress Notes (Signed)
Date last pap: 10-27-22. Last Depo-Provera: 01-20-23. Side Effects if any: Pt tolerated well. Serum HCG indicated? NA; Depo given on schedule. Depo-Provera 150 mg IM given by: Hope Pigeon, CMA in the LD per pt request. Next appointment due 10/25-11/8.

## 2023-07-10 ENCOUNTER — Ambulatory Visit: Payer: Managed Care, Other (non HMO)

## 2023-07-10 VITALS — BP 117/79 | HR 71

## 2023-07-10 DIAGNOSIS — Z3042 Encounter for surveillance of injectable contraceptive: Secondary | ICD-10-CM | POA: Diagnosis not present

## 2023-07-10 MED ORDER — MEDROXYPROGESTERONE ACETATE 150 MG/ML IM SUSP
150.0000 mg | Freq: Once | INTRAMUSCULAR | Status: AC
  Administered 2023-07-10: 150 mg via INTRAMUSCULAR

## 2023-07-10 NOTE — Progress Notes (Signed)
Depo given in LD.

## 2023-07-10 NOTE — Progress Notes (Signed)
Date last pap: 10/27/2022. Last Depo-Provera: 04/17/2023. Side Effects if any: None. Serum HCG indicated? N/A. Depo-Provera 150 mg IM given by: Kathlen Mody B.,RN, BSN. Inj given in RD. Patient tolerated well.Marland Kitchen Next appointment due Jan 17-Jan 31.

## 2023-07-22 DIAGNOSIS — M5431 Sciatica, right side: Secondary | ICD-10-CM | POA: Insufficient documentation

## 2023-08-04 ENCOUNTER — Ambulatory Visit (HOSPITAL_BASED_OUTPATIENT_CLINIC_OR_DEPARTMENT_OTHER): Payer: Managed Care, Other (non HMO) | Admitting: Family Medicine

## 2023-08-04 ENCOUNTER — Encounter (HOSPITAL_BASED_OUTPATIENT_CLINIC_OR_DEPARTMENT_OTHER): Payer: Self-pay | Admitting: Family Medicine

## 2023-08-04 VITALS — BP 145/106 | HR 79 | Ht 67.0 in | Wt 203.6 lb

## 2023-08-04 DIAGNOSIS — Z7689 Persons encountering health services in other specified circumstances: Secondary | ICD-10-CM | POA: Diagnosis not present

## 2023-08-04 DIAGNOSIS — M5441 Lumbago with sciatica, right side: Secondary | ICD-10-CM | POA: Diagnosis not present

## 2023-08-04 DIAGNOSIS — R030 Elevated blood-pressure reading, without diagnosis of hypertension: Secondary | ICD-10-CM | POA: Diagnosis not present

## 2023-08-04 MED ORDER — PREDNISONE 10 MG PO TABS: ORAL_TABLET | ORAL | 0 refills | Status: AC

## 2023-08-04 NOTE — Patient Instructions (Signed)

## 2023-08-04 NOTE — Progress Notes (Signed)
New Patient Office Visit  Subjective    Patient ID: Ellen Mooney, female    DOB: 26-Feb-1991  Age: 32 y.o. MRN: [id number]  CC:  Chief Complaint  Patient presents with   Sciatica Pain    Numbness in right leg, ongoing for about 4 weeks. Low back pain, radiates down leg   HPI Ellen Mooney is a 32 year old female who presents to establish with Primary Care & Sports Medicine at [facility].   Patient reports a few days prior to her visit on 07/22/2023, she slept wrong on the couch and noticed severe low back pain with numbness/tingling radiating down to her right calf. She made an appt with her nurse at work (works in [employer] but does not do any heavy lifting). She was prescribed Robaxin & meloxicam with minimal relief. She reports having another visit for the same complaints on 07/31/2023. At this time, she was prescribed gabapentin, naproxen, and lidocaine patches. She reports that the gabapentin does not help improve the neuropathy and makes her drowsy. She reports the naproxen helps temporarily.  Over the past week, tingling/numbness sensation and pain has been up/down. Some days pain is ok and some days are unbearable. Today she rates a 5/10. A couple weeks ago, she reports lying on the floor trying to put on her pants she was in so much pain.  Sitting hurts for a long period of time  R leg- numbness/tingling from low back to calf  Knee up to waist is throbbing pain Feels pinching in lower back Denies saddle anesthesia, changes to bowel or bladder function, one-sided weakness, fever/chills, night sweats, recent infection, abdominal pain, urinary symptoms.     Outpatient Encounter Medications as of 08/04/2023  Medication Sig   albuterol (VENTOLIN HFA) 108 (90 Base) MCG/ACT inhaler Inhale into the lungs.   Aspirin-Caffeine (BC FAST PAIN RELIEF PO) Take by mouth.   budesonide-formoterol (SYMBICORT) 80-4.5 MCG/ACT inhaler Inhale into the lungs.   EPINEPHrine  0.3 mg/0.3 mL IJ SOAJ injection Inject 0.3 mg into the muscle as needed for anaphylaxis.   gabapentin (NEURONTIN) 100 MG capsule Take by mouth.   ipratropium-albuterol (DUONEB) 0.5-2.5 (3) MG/3ML SOLN Inhale into the lungs.   medroxyPROGESTERone (DEPO-PROVERA) 150 MG/ML injection Inject 1 mL (150 mg total) into the muscle every 3 (three) months.   meloxicam (MOBIC) 15 MG tablet Take by mouth.   methocarbamol (ROBAXIN) 500 MG tablet Take by mouth.   Misc Natural Products (ELDERBERRY ZINC/VIT C/IMMUNE MT) Use as directed in the mouth or throat.   Multiple Vitamin (MULTI-VITAMIN DAILY PO) Take by mouth.   naproxen (NAPROSYN) 500 MG tablet Take by mouth.   predniSONE (DELTASONE) 10 MG tablet Days 1-4 take 4 tablets (40mg ), Days 5-8 take 3 tablets (30mg ), Days 9-11 take 2 tablets (20mg ), Days 12-14 take 1 tablet (10mg ) daily   [DISCONTINUED] predniSONE (DELTASONE) 10 MG tablet Days 1-4 take 4 tablets (40 mg) daily  Days 5-8 take 3 tablets (30 mg) daily, Days 9-11 take 2 tablets (20 mg) daily, Days 12-14 take 1 tablet (10 mg) daily. (Patient not taking: Reported on 08/04/2023)   Facility-Administered Encounter Medications as of 08/04/2023  Medication   medroxyPROGESTERone (DEPO-PROVERA) injection 150 mg    Past Medical History:  Diagnosis Date   Asthma 1991   Known health problems: none     Past Surgical History:  Procedure Laterality Date   NO PAST SURGERIES      Family History  Problem Relation Age of Onset  Diabetes Paternal Grandfather    Hypertension Paternal Grandfather    Stroke Paternal Grandfather    Heart attack Paternal Grandfather    Cancer Paternal Grandmother        Breast   Hypertension Maternal Grandmother    Diabetes Maternal Grandfather    Hypertension Maternal Grandfather    Thyroid disease Father    Hypertension Father    Spondylolisthesis Mother    Clotting disorder Mother        PE, DVT on Eliquis    Asthma Brother      Review of Systems   Constitutional:  Negative for chills, diaphoresis, fever and weight loss.  HENT:  Negative for ear pain and tinnitus.   Eyes:  Negative for blurred vision and double vision.  Respiratory:  Negative for cough and shortness of breath.   Cardiovascular:  Negative for chest pain, palpitations and leg swelling.  Gastrointestinal:  Negative for abdominal pain, constipation, diarrhea, nausea and vomiting.  Genitourinary:  Negative for frequency and urgency.  Musculoskeletal:  Positive for back pain.  Neurological:  Negative for dizziness, speech change, focal weakness, weakness and headaches.  Psychiatric/Behavioral:  Negative for depression and suicidal ideas. The patient is not nervous/anxious and does not have insomnia.     Objective    BP (!) 145/106 Comment: Repeat BP  Pulse 79   Ht 5\' 7"  (1.702 m)   Wt 203 lb 9.6 oz (92.4 kg)   BMI 31.89 kg/m   Physical Exam Vitals reviewed.  Constitutional:      Appearance: Normal appearance.  Cardiovascular:     Rate and Rhythm: Normal rate and regular rhythm.     Pulses: Normal pulses.     Heart sounds: Normal heart sounds.  Pulmonary:     Effort: Pulmonary effort is normal.     Breath sounds: Normal breath sounds.  Abdominal:     Tenderness: There is no abdominal tenderness. There is no right CVA tenderness or left CVA tenderness.  Musculoskeletal:     Cervical back: Normal.     Thoracic back: Normal.     Lumbar back: Tenderness present. Positive right straight leg raise test. Negative left straight leg raise test.     Right lower leg: No edema.     Left lower leg: No edema.  Neurological:     Mental Status: She is alert.     Motor: Motor function is intact.     Coordination: Coordination is intact.     Gait: Gait is intact.  Psychiatric:        Mood and Affect: Mood normal.        Behavior: Behavior normal.      Assessment & Plan:   1. Encounter to establish care Patient is a 51- year-old female who presents today to  establish care with primary care at [facility]. Reviewed the past medical history, family history, medications and allergies today- updates made as indicated. She has concerns today regarding acute low back pain that has been ongoing for a couple of weeks.   2. Acute right-sided low back pain with right-sided sciatica Patient presents today for concerns regarding low back pain with numbness/tingling sensation moving form her lower back down to her calf. She reports the pain today is 5/10 and is having a difficult time sitting completely on her buttocks. During the visit, she was leaning towards the left to avoid placing pressure on her right side. No red flags present. Denies saddle anesthesia, changes to bowel or bladder function, one-sided  weakness, fever/chills, night sweats, recent infection, abdominal pain, urinary symptoms, unintentional weight loss. Physical exam with positive SLR on right leg. Negative SLR on left leg. Muscle strength 5/5 in upper and lower extremities. No progressive weakness present. No neurological symptoms present. Will trial prednisone for 14 days and follow-up at that time. Advised her to continue with naproxen PRN and use heat/ice for pain relief. If pain continues would be reasonable to obtain imaging- lumbar x-ray and/or MRI.   3. Elevated BP without diagnosis of hypertension Patient presents today with elevated blood pressure, repeat with no improvement. Patient in no acute distress and is well-appearing. Denies chest pain, shortness of breath, lower extremity edema, vision changes, headaches. Cardiovascular exam with heart regular rate and rhythm. Normal heart sounds, no murmurs present. No lower extremity edema present. Lungs clear to auscultation bilaterally. Advised patient to closely monitor blood pressure at home. Return to office sooner if blood pressure begins to increase greater than 130/80. Follow-up in 2-4 weeks.     Return in about 2 weeks (around  08/18/2023) for BP f/u & back pain.   Alyson Reedy, FNP

## 2023-08-12 ENCOUNTER — Encounter (HOSPITAL_BASED_OUTPATIENT_CLINIC_OR_DEPARTMENT_OTHER): Payer: Self-pay | Admitting: Family Medicine

## 2023-09-08 ENCOUNTER — Ambulatory Visit (HOSPITAL_BASED_OUTPATIENT_CLINIC_OR_DEPARTMENT_OTHER): Payer: Managed Care, Other (non HMO) | Admitting: Family Medicine

## 2023-09-22 ENCOUNTER — Encounter (HOSPITAL_BASED_OUTPATIENT_CLINIC_OR_DEPARTMENT_OTHER): Payer: Self-pay | Admitting: Family Medicine

## 2023-09-22 ENCOUNTER — Ambulatory Visit (HOSPITAL_BASED_OUTPATIENT_CLINIC_OR_DEPARTMENT_OTHER): Payer: Managed Care, Other (non HMO) | Admitting: Family Medicine

## 2023-09-22 VITALS — BP 119/77 | HR 62 | Ht 66.5 in | Wt 206.0 lb

## 2023-09-22 DIAGNOSIS — R030 Elevated blood-pressure reading, without diagnosis of hypertension: Secondary | ICD-10-CM

## 2023-09-22 DIAGNOSIS — M5441 Lumbago with sciatica, right side: Secondary | ICD-10-CM | POA: Diagnosis not present

## 2023-09-22 NOTE — Progress Notes (Signed)
 Subjective:   Ellen Mooney 1991-05-30 09/22/2023  Chief Complaint  Patient presents with   Medical Management of Chronic Issues    Patient states the back pain is beginning to come back.    HPI: Ellen Mooney presents today for re-assessment and management of chronic medical conditions.  ELEVATED BP READING:  Ellen Mooney presents for the observation of elevated BP reading at her last visit. .  Patient's current hypertension medication regimen is: None Patient is  regularly keeping a check on BP at home.  Denies headache, dizziness, CP, SHOB, vision changes.    BP Readings from Last 3 Encounters:  09/22/23 119/77  08/04/23 (!) 145/106  07/10/23 117/79    BACK PAIN:  Patient has been treated for repeated sciatic nerve flare by her work provider on 11/13, 11/22 and on 08/04/23 by PCP Evalene Arts.  She was prescribed Prednisone  a two week taper 08/04/23 and had moderate relief. She states it has improved but is not completely resolved. Pain is rated 4/10. Reports pain is localized to right lumbar spine, denies radiation down right leg. Denies shooting pain, numbness, weakness. She states pain worsens with standing.  She is not taking any OTC medications for it currently. Denies recent injuries, falls, accidents. She works in naval architect at [employer] and walks frequently (8.5-10.5 miles per day per patient), lifts under 40lbs. Reports wears steel toed boots at work.   The following portions of the patient's history were reviewed and updated as appropriate: past medical history, past surgical history, family history, social history, allergies, medications, and problem list.   Patient Active Problem List   Diagnosis Date Noted   Bilateral sciatica 07/22/2023   Atopic dermatitis 03/20/2023   Papanicolaou smear of cervix with low risk human papillomavirus (HPV) DNA test positive 10/27/2022   Maxillary sinusitis 09/29/2022   Elevated serum creatinine 09/27/2019    Family history of thyroid disease in father 09/27/2019   Elevated BP without diagnosis of hypertension 09/27/2019   Family history of hypertension 09/27/2019   Obesity (BMI 30-39.9) 09/27/2019   Gastroesophageal reflux disease 11/22/2018   Initiation of Depo Provera  03/22/2018   Past Medical History:  Diagnosis Date   Asthma 1991   Known health problems: none    Past Surgical History:  Procedure Laterality Date   NO PAST SURGERIES     Family History  Problem Relation Age of Onset   Diabetes Paternal Grandfather    Hypertension Paternal Grandfather    Stroke Paternal Grandfather    Heart attack Paternal Grandfather    Cancer Paternal Grandmother        Breast   Hypertension Maternal Grandmother    Diabetes Maternal Grandfather    Hypertension Maternal Grandfather    Thyroid disease Father    Hypertension Father    Spondylolisthesis Mother    Clotting disorder Mother        PE, DVT on Eliquis    Asthma Brother    Outpatient Medications Prior to Visit  Medication Sig Dispense Refill   albuterol (VENTOLIN HFA) 108 (90 Base) MCG/ACT inhaler Inhale into the lungs.     Aspirin-Caffeine (BC FAST PAIN RELIEF PO) Take by mouth.     budesonide-formoterol (SYMBICORT) 80-4.5 MCG/ACT inhaler Inhale into the lungs.     EPINEPHrine  0.3 mg/0.3 mL IJ SOAJ injection Inject 0.3 mg into the muscle as needed for anaphylaxis. 1 each 0   gabapentin (NEURONTIN) 100 MG capsule Take by mouth.     ipratropium-albuterol (DUONEB) 0.5-2.5 (  3) MG/3ML SOLN Inhale into the lungs.     medroxyPROGESTERone  (DEPO-PROVERA ) 150 MG/ML injection Inject 1 mL (150 mg total) into the muscle every 3 (three) months. 1 mL 1   meloxicam (MOBIC) 15 MG tablet Take by mouth.     Misc Natural Products (ELDERBERRY ZINC/VIT C/IMMUNE MT) Use as directed in the mouth or throat.     Multiple Vitamin (MULTI-VITAMIN DAILY PO) Take by mouth.     naproxen (NAPROSYN) 500 MG tablet Take by mouth.     predniSONE  (DELTASONE ) 10 MG  tablet Days 1-4 take 4 tablets (40mg ), Days 5-8 take 3 tablets (30mg ), Days 9-11 take 2 tablets (20mg ), Days 12-14 take 1 tablet (10mg ) daily 37 tablet 0   Facility-Administered Medications Prior to Visit  Medication Dose Route Frequency Provider Last Rate Last Admin   medroxyPROGESTERone  (DEPO-PROVERA ) injection 150 mg  150 mg Intramuscular Q90 days Ervin, Michael L, MD   150 mg at 02/17/22 1024   Allergies  Allergen Reactions   Kiwi Extract Anaphylaxis   Peanut-Containing Drug Products Anaphylaxis     ROS: A complete ROS was performed with pertinent positives/negatives noted in the HPI. The remainder of the ROS are negative.    Objective:   Today's Vitals   09/22/23 1327  BP: 119/77  Pulse: 62  Weight: 206 lb (93.4 kg)  Height: 5' 6.5 (1.689 m)  PainSc: 4   PainLoc: Back    Physical Exam          GENERAL: Well-appearing, in NAD. Well nourished.  SKIN: Pink, warm and dry. No rash, lesion, ulceration, or ecchymoses.  Head: Normocephalic. NECK: Trachea midline. Full ROM w/o pain or tenderness.  RESPIRATORY: Chest wall symmetrical. Respirations even and non-labored.  MSK: Muscle tone and strength appropriate for age. Joints w/o tenderness, redness, or swelling. Negative straight leg raise. Full ROM present to BLE. Full strength present. No step off, deformity, or crepitus to spine. No point tenderness.  EXTREMITIES: Without clubbing, cyanosis, or edema.  NEUROLOGIC: No motor or sensory deficits. Steady, even gait. C2-C12 intact.  PSYCH/MENTAL STATUS: Alert, oriented x 3. Cooperative, appropriate mood and affect.   Health Maintenance Due  Topic Date Due   Pneumococcal Vaccine 50-61 Years old (1 of 2 - PCV) Never done       Assessment & Plan:  1. Acute right-sided low back pain with right-sided sciatica (Primary) Improved. Pt to continue stretches, use NSAIDs as needed, recommend supportive arch footwear, use ice/heat and recommend Chiropractic or Orthopedic therapy if  no improvement in 1-2 weeks.   2. Elevated BP without diagnosis of hypertension Resolved. BP within normal limits. Elevation likely due to pain previously.    Return in about 6 months (around 03/21/2024) for ANNUAL PHYSICAL (fasting labs at visit) .    Patient to reach out to office if new, worrisome, or unresolved symptoms arise or if no improvement in patient's condition. Patient verbalized understanding and is agreeable to treatment plan. All questions answered to patient's satisfaction.    Thersia Schuyler Stark, [place]

## 2023-09-22 NOTE — Patient Instructions (Addendum)
 Continue stretches daily, use Ibuprofen 600-800mg  2-3x per day with food or Aleve (250-500mg  daily).   Use ice, heat frequently to back.   [facility]  Dr. Glynis Smiles   [address]  [phone]

## 2023-10-05 ENCOUNTER — Ambulatory Visit: Payer: Managed Care, Other (non HMO)

## 2023-10-08 ENCOUNTER — Ambulatory Visit: Payer: Managed Care, Other (non HMO) | Admitting: General Practice

## 2023-10-08 VITALS — BP 119/77 | HR 69 | Ht 67.0 in | Wt 207.0 lb

## 2023-10-08 DIAGNOSIS — Z3042 Encounter for surveillance of injectable contraceptive: Secondary | ICD-10-CM

## 2023-10-08 MED ORDER — MEDROXYPROGESTERONE ACETATE 150 MG/ML IM SUSP: 150.0000 mg | INTRAMUSCULAR | 0 refills | Status: AC

## 2023-10-08 MED ORDER — MEDROXYPROGESTERONE ACETATE 150 MG/ML IM SUSP
150.0000 mg | Freq: Once | INTRAMUSCULAR | Status: AC
  Administered 2023-10-08: 150 mg via INTRAMUSCULAR

## 2023-10-08 NOTE — Progress Notes (Signed)
Date last pap: 10-27-22. Last Depo-Provera: 07-10-23. Side Effects if any: N/A pt tolerated well . Serum HCG indicated? N/A Depo given on schedule. Depo-Provera 150 mg IM given by: Hope Pigeon, CMA in the LD per pt request. Next appointment due 4/17-5/1.

## 2023-11-23 ENCOUNTER — Encounter: Payer: Self-pay | Admitting: Obstetrics & Gynecology

## 2023-11-23 ENCOUNTER — Other Ambulatory Visit (HOSPITAL_COMMUNITY)
Admission: RE | Admit: 2023-11-23 | Discharge: 2023-11-23 | Disposition: A | Discharge status: Home / Self Care | Source: Ambulatory Visit | Attending: Obstetrics & Gynecology | Admitting: Obstetrics & Gynecology

## 2023-11-23 ENCOUNTER — Ambulatory Visit: Payer: Managed Care, Other (non HMO) | Admitting: Obstetrics & Gynecology

## 2023-11-23 VITALS — BP 137/89 | HR 85 | Ht 66.5 in | Wt 199.0 lb

## 2023-11-23 DIAGNOSIS — Z23 Encounter for immunization: Secondary | ICD-10-CM | POA: Diagnosis not present

## 2023-11-23 DIAGNOSIS — Z01419 Encounter for gynecological examination (general) (routine) without abnormal findings: Secondary | ICD-10-CM

## 2023-11-23 DIAGNOSIS — B977 Papillomavirus as the cause of diseases classified elsewhere: Secondary | ICD-10-CM | POA: Insufficient documentation

## 2023-11-23 DIAGNOSIS — Z8349 Family history of other endocrine, nutritional and metabolic diseases: Secondary | ICD-10-CM | POA: Diagnosis not present

## 2023-11-23 DIAGNOSIS — Z124 Encounter for screening for malignant neoplasm of cervix: Secondary | ICD-10-CM

## 2023-11-23 MED ORDER — MEDROXYPROGESTERONE ACETATE 150 MG/ML IM SUSP: 150.0000 mg | INTRAMUSCULAR | 5 refills | Status: AC

## 2023-11-23 NOTE — Progress Notes (Signed)
 Declines STI testing. No problems. Needs Depo refill.

## 2023-11-23 NOTE — Progress Notes (Signed)
 GYNECOLOGY ANNUAL PHYSICAL EXAM PROGRESS NOTE  Subjective:    Ellen Mooney is a 33 y.o. single G0 who presents for an annual exam.  The patient is not currently sexually active (since she broke up with boyfriend 07/2023). The patient participates in regular exercise: no. Has the patient ever been transfused or tattooed?: yes. The patient reports that there is not domestic violence in her life.   The patient has the following complaints today: She needs her prescription refilled for depo provera. She is happy with this method, only has occasional spotting.  Menstrual History: Patient's last menstrual period was 10/27/2023 (approximate).     Gynecologic History:  Contraception: abstinence and wants to stay on depo provera for menstrual management History of STI's:  Last Pap: 2024, was normal. She had HR HPV on pap 2023.    OB History  Gravida Para Term Preterm AB Living  1 0 0 0 1 0  SAB IAB Ectopic Multiple Live Births  0 1 0 0 0    # Outcome Date GA Lbr Len/2nd Weight Sex Type Anes PTL Lv  1 IAB 03/05/18     TAB       Past Medical History:  Diagnosis Date   Asthma 1991   Known health problems: none     Past Surgical History:  Procedure Laterality Date   NO PAST SURGERIES      Family History  Problem Relation Age of Onset   Diabetes Paternal Grandfather    Hypertension Paternal Grandfather    Stroke Paternal Grandfather    Heart attack Paternal Grandfather    Cancer Paternal Grandmother        Breast   Hypertension Maternal Grandmother    Diabetes Maternal Grandfather    Hypertension Maternal Grandfather    Thyroid disease Father    Hypertension Father    Spondylolisthesis Mother    Clotting disorder Mother        PE, DVT on Eliquis    Asthma Brother     Social History   Socioeconomic History   Marital status: Single    Spouse name: Not on file   Number of children: Not on file   Years of education: Not on file   Highest education level:  Not on file  Occupational History   Not on file  Tobacco Use   Smoking status: Former    Types: E-cigarettes   Smokeless tobacco: Never   Tobacco comments:    Juuel Vape  Vaping Use   Vaping status: Former  Substance and Sexual Activity   Alcohol use: Yes    Comment: drinks 1 per week    Drug use: No   Sexual activity: Yes    Birth control/protection: Injection  Other Topics Concern   Not on file  Social History Narrative   Not on file   Social Drivers of Health   Financial Resource Strain: Low Risk  (09/22/2023)   Overall Financial Resource Strain (CARDIA)    Difficulty of Paying Living Expenses: Not hard at all  Food Insecurity: No Food Insecurity (09/22/2023)   Hunger Vital Sign    Worried About Running Out of Food in the Last Year: Never true    Ran Out of Food in the Last Year: Never true  Transportation Needs: No Transportation Needs (09/22/2023)   PRAPARE - Administrator, Civil Service (Medical): No    Lack of Transportation (Non-Medical): No  Physical Activity: Sufficiently Active (09/22/2023)   Exercise Vital Sign  Days of Exercise per Week: 4 days    Minutes of Exercise per Session: 60 min  Stress: No Stress Concern Present (09/22/2023)   [employer] of Occupational Health - Occupational Stress Questionnaire    Feeling of Stress : Not at all  Social Connections: Moderately Isolated (09/22/2023)   Social Connection and Isolation Panel [NHANES]    Frequency of Communication with Friends and Family: More than three times a week    Frequency of Social Gatherings with Friends and Family: Twice a week    Attends Religious Services: More than 4 times per year    Active Member of [employer] or Organizations: No    Attends Banker Meetings: Never    Marital Status: Never married  Intimate Partner Violence: Not At Risk (09/22/2023)   Humiliation, Afraid, Rape, and Kick questionnaire    Fear of Current or Ex-Partner: No    Emotionally Abused:  No    Physically Abused: No    Sexually Abused: No    Current Outpatient Medications on File Prior to Visit  Medication Sig Dispense Refill   albuterol (VENTOLIN HFA) 108 (90 Base) MCG/ACT inhaler Inhale into the lungs.     Aspirin-Caffeine (BC FAST PAIN RELIEF PO) Take by mouth.     EPINEPHrine 0.3 mg/0.3 mL IJ SOAJ injection Inject 0.3 mg into the muscle as needed for anaphylaxis. 1 each 0   medroxyPROGESTERone (DEPO-PROVERA) 150 MG/ML injection Inject 1 mL (150 mg total) into the muscle every 3 (three) months. 1 mL 0   Misc Natural Products (ELDERBERRY ZINC/VIT C/IMMUNE MT) Use as directed in the mouth or throat.     Multiple Vitamin (MULTI-VITAMIN DAILY PO) Take by mouth.     budesonide-formoterol (SYMBICORT) 80-4.5 MCG/ACT inhaler Inhale into the lungs.     gabapentin (NEURONTIN) 100 MG capsule Take by mouth. (Patient not taking: Reported on 11/23/2023)     ipratropium-albuterol (DUONEB) 0.5-2.5 (3) MG/3ML SOLN Inhale into the lungs.     meloxicam (MOBIC) 15 MG tablet Take by mouth. (Patient not taking: Reported on 11/23/2023)     naproxen (NAPROSYN) 500 MG tablet Take by mouth. (Patient not taking: Reported on 11/23/2023)     predniSONE (DELTASONE) 10 MG tablet Days 1-4 take 4 tablets (40mg ), Days 5-8 take 3 tablets (30mg ), Days 9-11 take 2 tablets (20mg ), Days 12-14 take 1 tablet (10mg ) daily (Patient not taking: Reported on 11/23/2023) 37 tablet 0   Current Facility-Administered Medications on File Prior to Visit  Medication Dose Route Frequency Provider Last Rate Last Admin   medroxyPROGESTERone (DEPO-PROVERA) injection 150 mg  150 mg Intramuscular Q90 days Hermina Staggers, MD   150 mg at 02/17/22 1024    Allergies  Allergen Reactions   Kiwi Extract Anaphylaxis   Peanut-Containing Drug Products Anaphylaxis     Review of Systems Constitutional: negative for chills, fatigue, fevers and sweats Eyes: negative for irritation, redness and visual disturbance Ears, nose, mouth,  throat, and face: negative for hearing loss, nasal congestion, snoring and tinnitus Respiratory: negative for asthma, cough, sputum Cardiovascular: negative for chest pain, dyspnea, exertional chest pressure/discomfort, irregular heart beat, palpitations and syncope Gastrointestinal: negative for abdominal pain, change in bowel habits, nausea and vomiting Genitourinary: negative for abnormal menstrual periods, genital lesions, sexual problems and vaginal discharge, dysuria and urinary incontinence Integument/breast: negative for breast lump, breast tenderness and nipple discharge Hematologic/lymphatic: negative for bleeding and easy bruising Musculoskeletal:negative for back pain and muscle weakness Neurological: negative for dizziness, headaches, vertigo and weakness Endocrine: negative for  diabetic symptoms including polydipsia, polyuria and skin dryness Allergic/Immunologic: negative for hay fever and urticaria      Objective:  Blood pressure (!) 141/90, pulse 85, height 5' 6.5" (1.689 m), weight 199 lb (90.3 kg), last menstrual period 10/27/2023. Body mass index is 31.64 kg/m.    General Appearance:    Alert, cooperative, no distress, appears stated age  Head:    Normocephalic, without obvious abnormality, atraumatic  Eyes:    PERRL, conjunctiva/corneas clear, EOM's intact, both eyes  Ears:    Normal external ear canals, both ears  Nose:   Nares normal, septum midline, mucosa normal, no drainage or sinus tenderness  Throat:   Lips, mucosa, and tongue normal; teeth and gums normal  Neck:   Supple, symmetrical, trachea midline, no adenopathy; thyroid: no enlargement/tenderness/nodules; no carotid bruit or JVD  Back:     Symmetric, no curvature, ROM normal, no CVA tenderness  Lungs:     Clear to auscultation bilaterally, respirations unlabored  Chest Wall:    No tenderness or deformity   Heart:    Regular rate and rhythm, S1 and S2 normal, no murmur, rub or gallop  Breast Exam:    No  tenderness, masses, or nipple abnormality  Abdomen:     Soft, non-tender, bowel sounds active all four quadrants, no masses, no organomegaly.    Genitalia:    Pelvic:external genitalia normal, vagina without lesions, discharge, or tenderness, rectovaginal septum  normal. Cervix normal in appearance, no cervical motion tenderness, no adnexal masses or tenderness.  Uterus normal size, shape, mobile, regular contours, nontender.  Rectal:    Normal external sphincter.  No hemorrhoids appreciated. Internal exam not done.   Extremities:   Extremities normal, atraumatic, no cyanosis or edema  Pulses:   2+ and symmetric all extremities  Skin:   Skin color, texture, turgor normal, no rashes or lesions  Lymph nodes:   Cervical, supraclavicular, and axillary nodes normal  Neurologic:   CNII-XII intact, normal strength, sensation and reflexes throughout   .  Labs:  Lab Results  Component Value Date   WBC 8.9 11/15/2020   HGB 12.7 11/15/2020   HCT 37.6 11/15/2020   MCV 85 11/15/2020   PLT 240 11/15/2020    Lab Results  Component Value Date   CREATININE 0.84 11/15/2020   BUN 5 (L) 11/15/2020   NA 139 11/15/2020   K 3.8 11/15/2020   CL 103 11/15/2020   CO2 21 11/15/2020    Lab Results  Component Value Date   ALT 11 11/15/2020   AST 15 11/15/2020   ALKPHOS 42 (L) 11/15/2020   BILITOT 0.5 11/15/2020    Lab Results  Component Value Date   TSH 2.230 11/15/2020     Assessment:   1. Well woman exam with routine gynecological exam   2. High risk HPV infection   3. Family history of thyroid disease in father      Plan:   Breast self exam technique reviewed and patient encouraged to perform self-exam monthly. Contraception:  continue depo provera, last shot 09/28/2023 . Discussed healthy lifestyle modifications. Pap smear ordered.  Start Gardasil today   Marice Potter, Leanora Ivanoff, MD [facility]

## 2023-11-25 LAB — CYTOLOGY - PAP
Diagnosis: NEGATIVE
Diagnosis: REACTIVE

## 2023-12-23 NOTE — Progress Notes (Signed)
  Obesity: Patient complains of obesity. Patient cites health as reasons for wanting to lose weight.  Side effects : dry mouth (drinking water )   Obesity History  Amount of time at present weight:  unknown  Weight today : 201 lbs Goals weight 180lbs   History of Weight Loss Efforts Greatest amount of weight lost: unknown lb over unknown  months Amount of time that loss was maintained: unknown months Circumstances associated with regain of weight: unknown   Successful weight loss techniques attempted: very low calorie diet intermittent fasting Unsuccessful weight loss techniques attempted: none  Current Exercise Habits walking  Current Eating Habits Number of regular meals per day: 2 Number of snacking episodes per day: 2 Who shops for food? patient Who prepares food? patient Who eats with patient? patient Binge behavior?: no Purge behavior? no Anorexic behavior? no Eating precipitated by stress? no Guilt feelings associated with eating? no  Other Potential Contributing Factors Use of alcohol: average 0 drinks/week Use of medications that may cause weight gain estrogens () History of past abuse? none  Comorbidities: none    The appropriate state Prescription Drug Monitoring Program database was queried prior to prescribing controlled substances. Patient is found to be filling prescriptions for controlled substances as intended.      Plan    1.  RX changes: see med list 2.  Education: illness management 3.  Compliance at present is estimated to be good. Efforts to improve compliance (if necessary) will be directed at  4.  Follow up: 6 weeks   For emergencies please dial 911 or go to your nearest ER Questions please call the office  Plan discussed with patient and patient verbalized understanding of plan    Continue 37.5 mg of phentermine    Assessment/Plan Problem List Items Addressed This Visit     Obesity (BMI 30-39.9) - Primary   Relevant Medications    phentermine 37.5 mg capsule     The appropriate state Prescription Drug Monitoring Program database was queried prior to prescribing controlled substances. Patient is found to be filling prescriptions for controlled substances as intended.

## 2023-12-31 ENCOUNTER — Ambulatory Visit

## 2023-12-31 VITALS — BP 112/71 | HR 80 | Wt 206.0 lb

## 2023-12-31 DIAGNOSIS — Z3042 Encounter for surveillance of injectable contraceptive: Secondary | ICD-10-CM

## 2023-12-31 NOTE — Progress Notes (Signed)
 Pt is in the office for depo injection. Administered in the LD per pt request, and pt tolerated well. Next due July 10-24. .. Administrations This Visit     medroxyPROGESTERone  (DEPO-PROVERA ) injection 150 mg     Admin Date 12/31/2023 Action Given Dose 150 mg Route Intramuscular Documented By Sarrah Cure, RN

## 2024-01-27 ENCOUNTER — Ambulatory Visit

## 2024-01-27 VITALS — BP 125/83 | HR 79 | Ht 67.0 in | Wt 206.0 lb

## 2024-01-27 DIAGNOSIS — Z23 Encounter for immunization: Secondary | ICD-10-CM | POA: Diagnosis not present

## 2024-01-27 NOTE — Patient Instructions (Signed)
 Los no charge

## 2024-01-27 NOTE — Progress Notes (Signed)
 Pt is in the office for 2nd Gardasil injection, administered in L Del and pt tolerated well. Next injection due in 4 mths

## 2024-02-04 NOTE — Progress Notes (Signed)
I have reviewed the documented history, ROS, physical exam, and decision making and attest to this patient's chart.

## 2024-03-18 ENCOUNTER — Ambulatory Visit

## 2024-03-23 ENCOUNTER — Ambulatory Visit

## 2024-03-23 ENCOUNTER — Encounter (HOSPITAL_BASED_OUTPATIENT_CLINIC_OR_DEPARTMENT_OTHER): Payer: Managed Care, Other (non HMO) | Admitting: Family Medicine

## 2024-03-23 DIAGNOSIS — Z3042 Encounter for surveillance of injectable contraceptive: Secondary | ICD-10-CM | POA: Diagnosis not present

## 2024-03-23 MED ORDER — MEDROXYPROGESTERONE ACETATE 150 MG/ML IM SUSP
150.0000 mg | Freq: Once | INTRAMUSCULAR | Status: AC
  Administered 2024-03-23: 150 mg via INTRAMUSCULAR

## 2024-03-23 NOTE — Progress Notes (Signed)
 Date last pap: 11/23/23. Last Depo-Provera : 12/31/23. Side Effects if any: NA. Serum HCG indicated? NA. Depo-Provera  150 mg IM given by: Duwaine Galla, RN. Next appointment due Oct 1-15 2025.

## 2024-06-01 ENCOUNTER — Ambulatory Visit

## 2024-06-01 ENCOUNTER — Encounter (HOSPITAL_BASED_OUTPATIENT_CLINIC_OR_DEPARTMENT_OTHER): Admitting: Family Medicine

## 2024-06-01 VITALS — BP 124/84 | HR 72 | Wt 199.9 lb

## 2024-06-01 DIAGNOSIS — Z23 Encounter for immunization: Secondary | ICD-10-CM

## 2024-06-01 NOTE — Progress Notes (Signed)
Pt is in the office for 3rd Gardasil injection. Administered in R Del and pt tolerated well.

## 2024-06-10 ENCOUNTER — Ambulatory Visit

## 2024-06-13 ENCOUNTER — Ambulatory Visit

## 2024-06-13 DIAGNOSIS — Z3042 Encounter for surveillance of injectable contraceptive: Secondary | ICD-10-CM

## 2024-06-13 MED ORDER — MEDROXYPROGESTERONE ACETATE 150 MG/ML IM SUSP
150.0000 mg | Freq: Once | INTRAMUSCULAR | Status: AC
  Administered 2024-06-13: 150 mg via INTRAMUSCULAR

## 2024-06-13 NOTE — Progress Notes (Signed)
 Date last pap: 11/23/23. Last Depo-Provera : 03/23/24. Side Effects if any: NA. Serum HCG indicated? NA. Depo-Provera  150 mg IM given by: Duwaine Galla, RN. Next appointment due Dec 22nd - Jan 5th.

## 2024-08-30 ENCOUNTER — Ambulatory Visit (INDEPENDENT_AMBULATORY_CARE_PROVIDER_SITE_OTHER)

## 2024-08-30 VITALS — BP 131/86 | HR 76 | Wt 217.5 lb

## 2024-08-30 DIAGNOSIS — Z3042 Encounter for surveillance of injectable contraceptive: Secondary | ICD-10-CM | POA: Diagnosis not present

## 2024-08-30 NOTE — Progress Notes (Signed)
..  Date last pap: 11/23/23. Last Depo-Provera : 06/13/24. Side Effects if any: N/a. Serum HCG indicated? N/a. Depo-Provera  150 mg IM given in R Del Next appointment due March 10- March 24.  .. Administrations This Visit     medroxyPROGESTERone  (DEPO-PROVERA ) injection 150 mg     Admin Date 08/30/2024 Action Given Dose 150 mg Route Intramuscular Documented By Doneta Laymon BIRCH, RN

## 2024-11-14 ENCOUNTER — Ambulatory Visit: Payer: Self-pay | Admitting: Obstetrics and Gynecology

## 2024-11-15 ENCOUNTER — Ambulatory Visit: Payer: Self-pay
